# Patient Record
Sex: Female | Born: 2000 | Race: Black or African American | Hispanic: No | Marital: Single | Smoking: Never smoker
Health system: Southern US, Community
[De-identification: ages and names within clinical notes are randomized; demographics above are authoritative.]

## PROBLEM LIST (undated history)

## (undated) DIAGNOSIS — D571 Sickle-cell disease without crisis: Secondary | ICD-10-CM

## (undated) DIAGNOSIS — M419 Scoliosis, unspecified: Secondary | ICD-10-CM

## (undated) DIAGNOSIS — J45909 Unspecified asthma, uncomplicated: Secondary | ICD-10-CM

---
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
## 2019-01-16 IMAGING — CR DG NECK SOFT TISSUE
2 series · 2 of 2 positions shown · non-contrast
Comparison: None.

CLINICAL DATA: Sore throat

EXAM:
NECK SOFT TISSUES - 1+ VIEW

[neck lat]
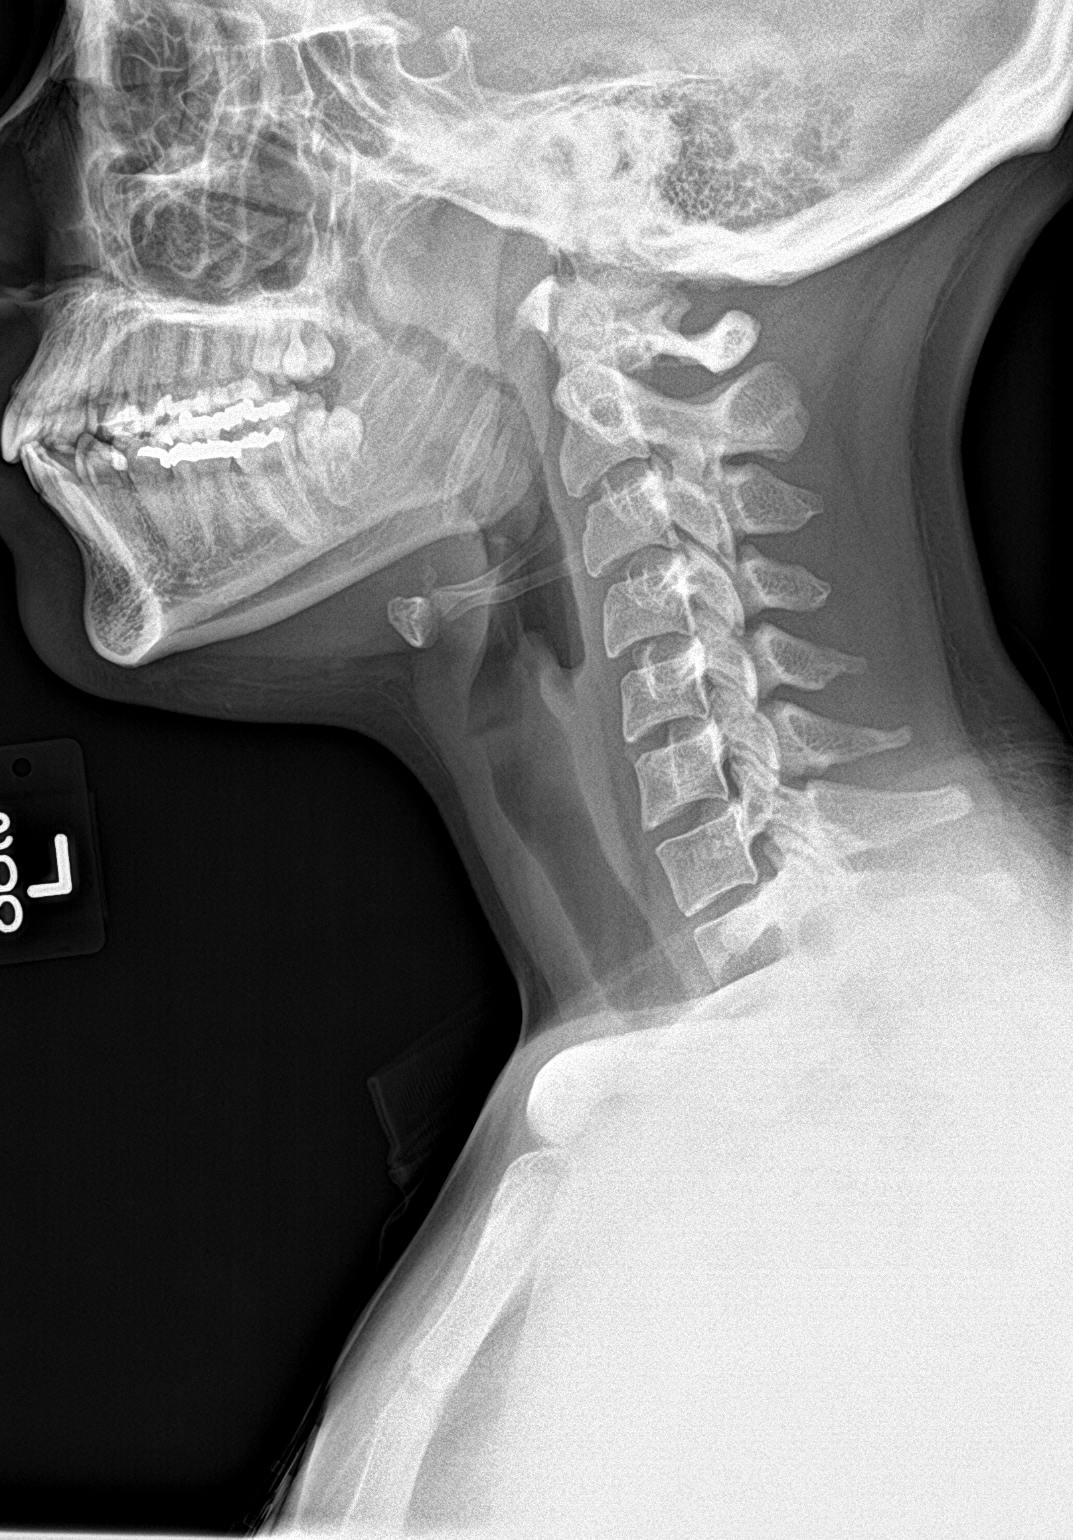

[neck ap]
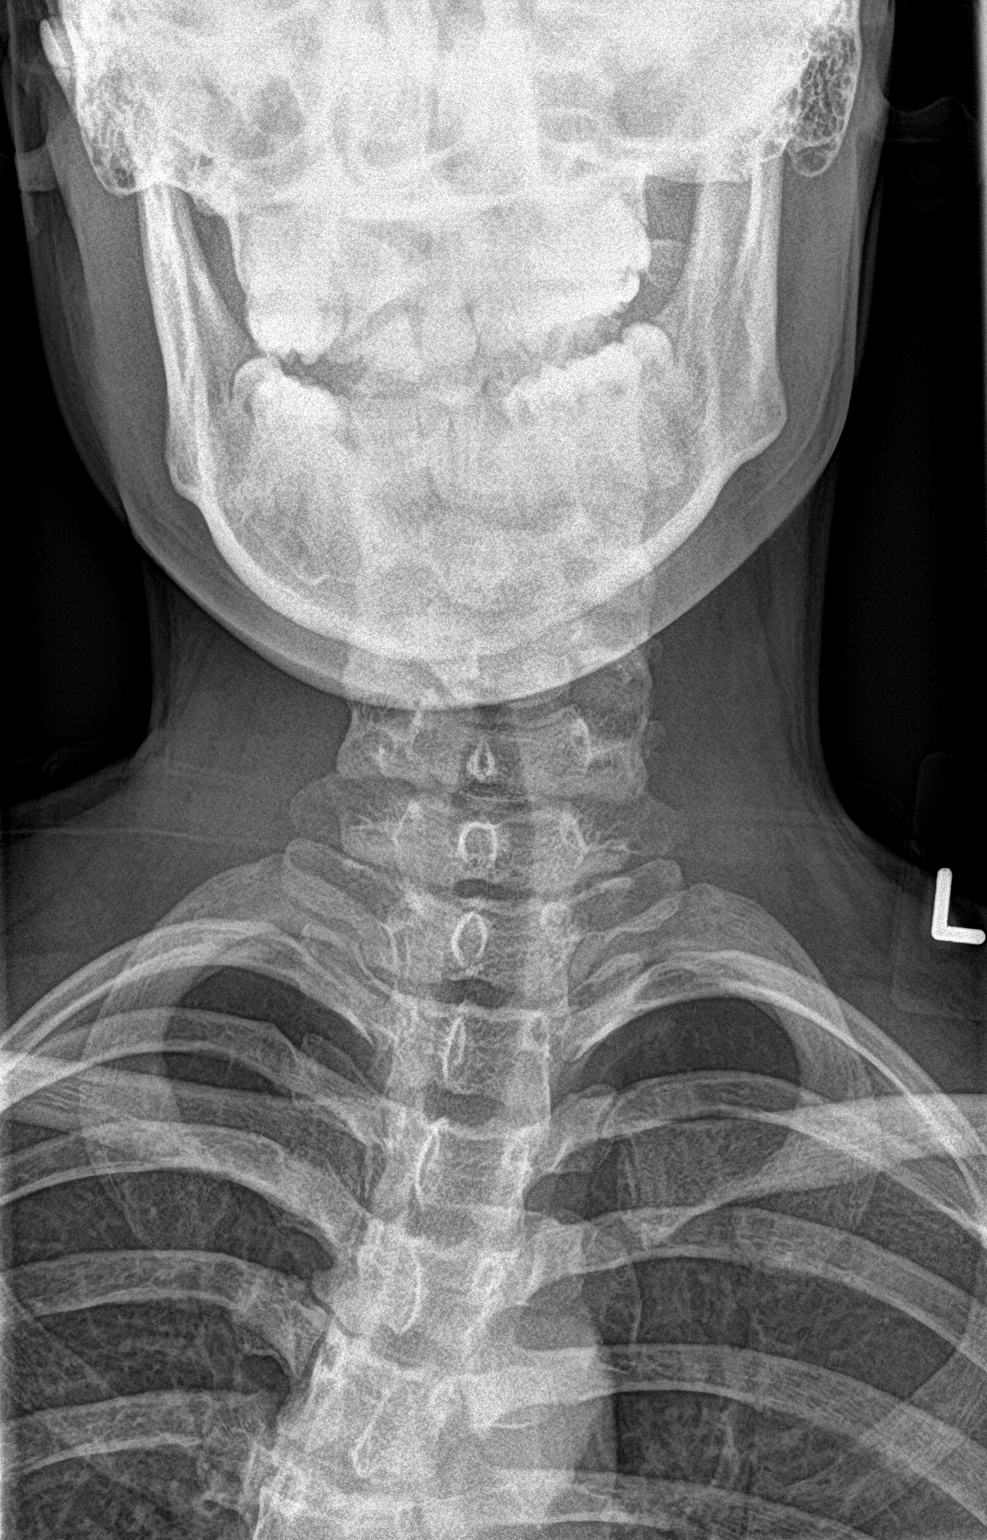

[2 of 2 positions shown; findings below may reference images not displayed]

FINDINGS: There is no evidence of retropharyngeal soft tissue swelling or
epiglottic enlargement. The cervical airway is unremarkable and no
radio-opaque foreign body identified.
IMPRESSION: Negative.

## 2019-01-16 IMAGING — CR DG CHEST 2V
2 series · 2 of 2 positions shown · non-contrast
Comparison: 11/03/2016

CLINICAL DATA: Pain

EXAM:
CHEST  2 VIEW

[chest pa]
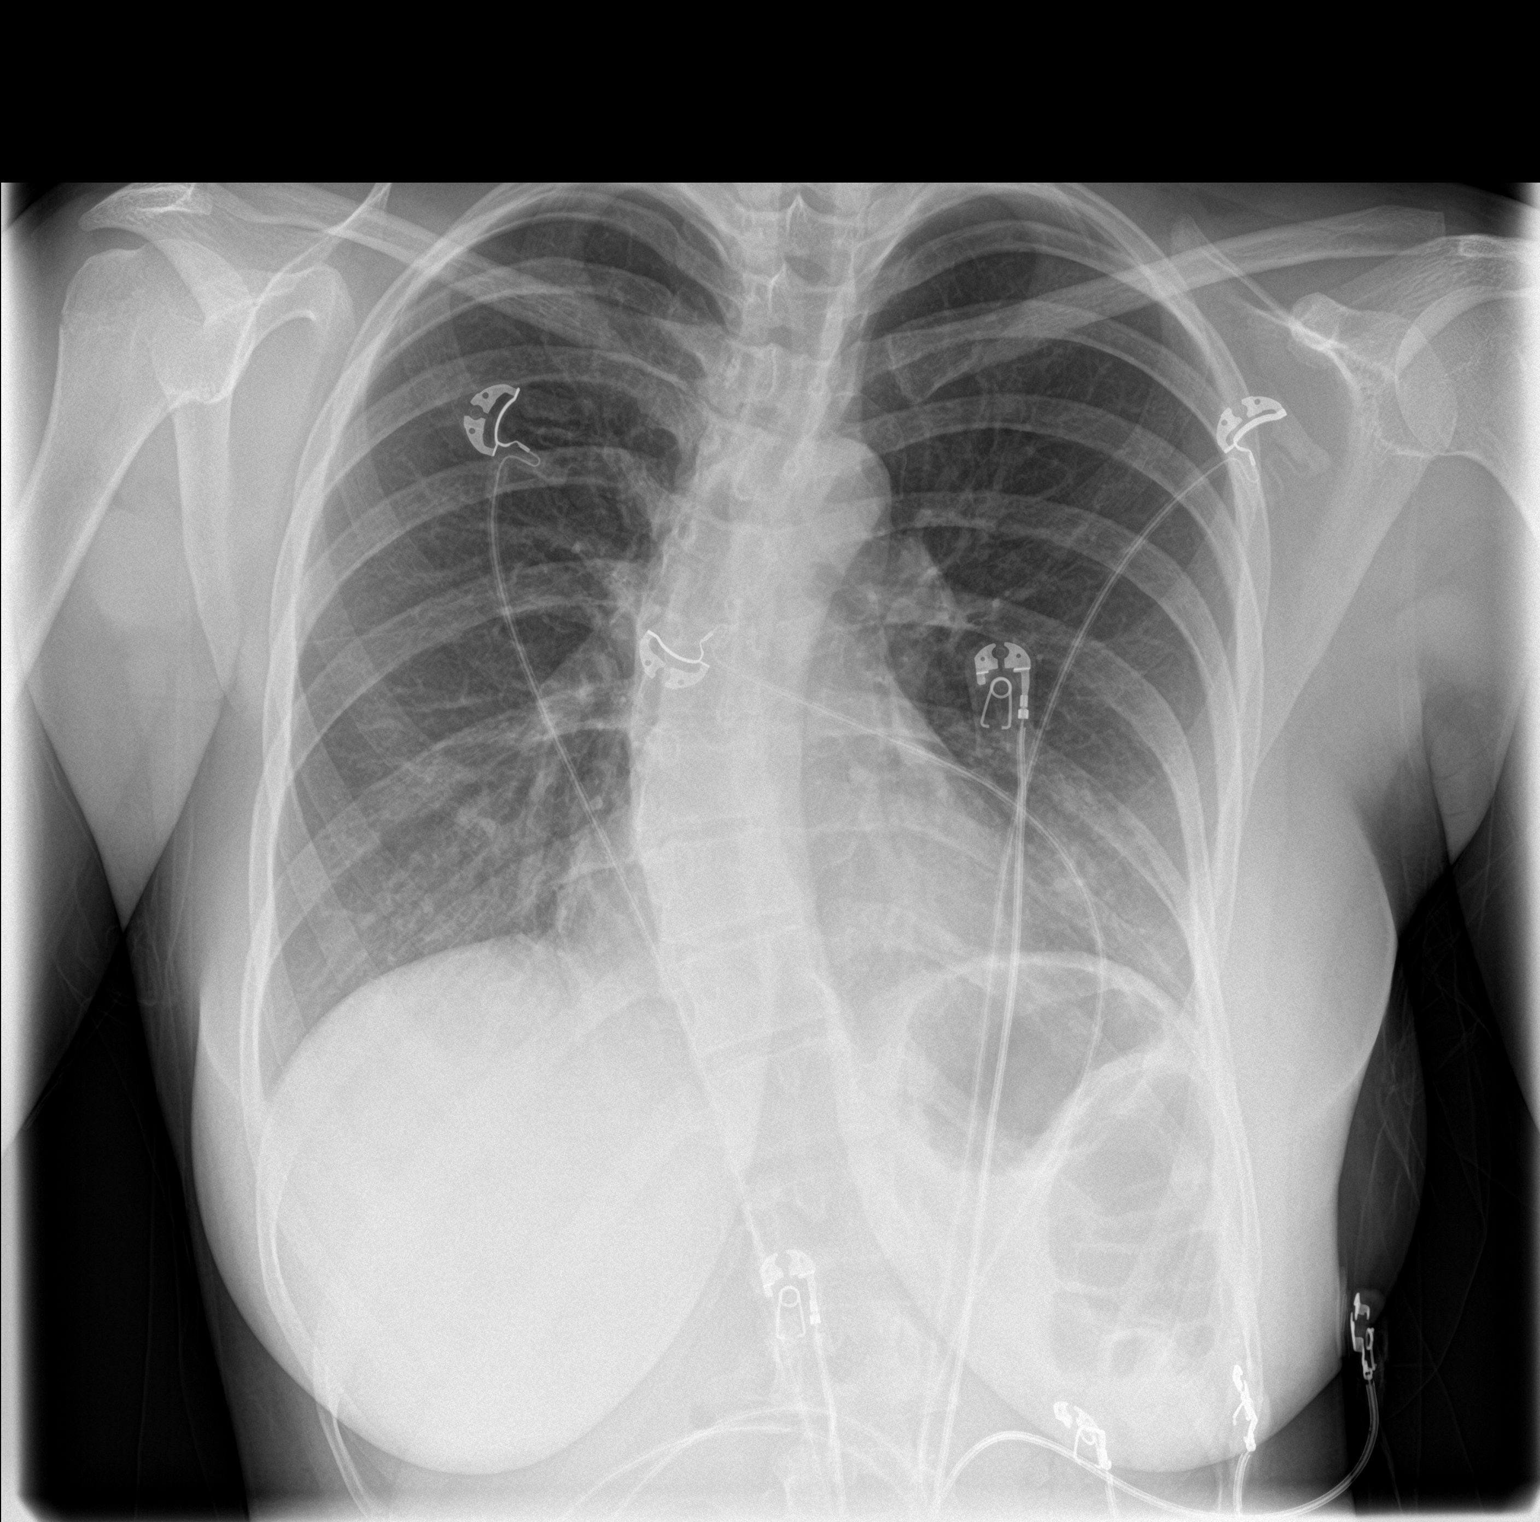

[chest lat]
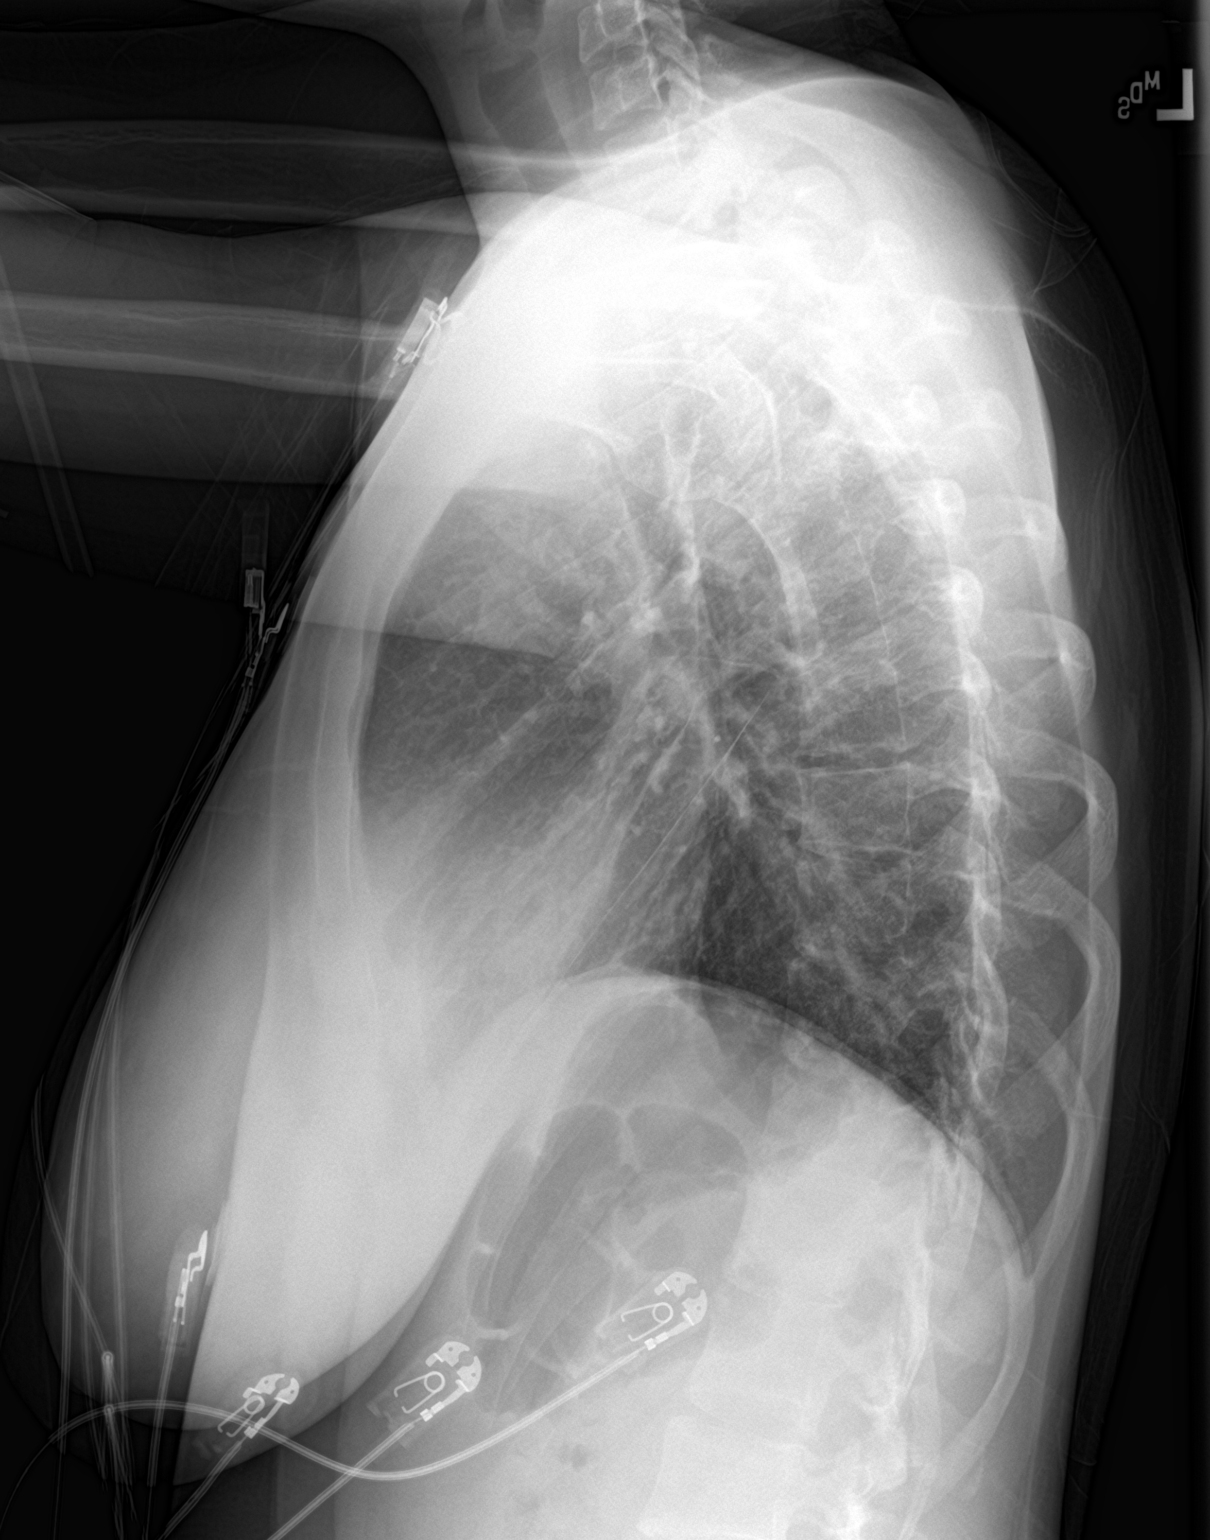

[2 of 2 positions shown; findings below may reference images not displayed]

FINDINGS: Heart and mediastinal contours are within normal limits. No focal
opacities or effusions. No acute bony abnormality. Thoracolumbar
scoliosis.
IMPRESSION: No active cardiopulmonary disease.

## 2019-08-21 ENCOUNTER — Telehealth: Payer: Medicaid Other | Admitting: Nurse Practitioner

## 2019-08-21 DIAGNOSIS — N3000 Acute cystitis without hematuria: Secondary | ICD-10-CM

## 2019-08-21 DIAGNOSIS — N760 Acute vaginitis: Secondary | ICD-10-CM | POA: Diagnosis not present

## 2019-08-21 DIAGNOSIS — B9689 Other specified bacterial agents as the cause of diseases classified elsewhere: Secondary | ICD-10-CM | POA: Diagnosis not present

## 2019-08-21 MED ORDER — METRONIDAZOLE 500 MG PO TABS: 500.0000 mg | ORAL_TABLET | Freq: Two times a day (BID) | ORAL | 0 refills | Status: DC

## 2019-08-21 MED ORDER — CEPHALEXIN 500 MG PO CAPS: 500.0000 mg | ORAL_CAPSULE | Freq: Two times a day (BID) | ORAL | 0 refills | Status: DC

## 2019-08-21 NOTE — Progress Notes (Signed)
I contacted patient by phone and she was describing urinary track symptoms. She also has discharge that is foul smelling and  Only smells when she voids.  We are sorry that you are not feeling well.  Here is how we plan to help!  Based on what you shared with me it looks like you most likely have a simple urinary tract infection.  A UTI (Urinary Tract Infection) is a bacterial infection of the bladder.  Most cases of urinary tract infections are simple to treat but a key part of your care is to encourage you to drink plenty of fluids and watch your symptoms carefully.  I have prescribed Keflex 500 mg twice a day for 7 days.  Your symptoms should gradually improve. Call us if the burning in your urine worsens, you develop worsening fever, back pain or pelvic pain or if your symptoms do not resolve after completing the antibiotic.  Urinary tract infections can be prevented by drinking plenty of water to keep your body hydrated.  Also be sure when you wipe, wipe from front to back and don't hold it in!  If possible, empty your bladder every 4 hours.  We are sorry that you are not feeling well. Here is how we plan to help! Based on what you shared with me it looks like you: May have a vaginosis due to bacteria  Vaginosis is an inflammation of the vagina that can result in discharge, itching and pain. The cause is usually a change in the normal balance of vaginal bacteria or an infection. Vaginosis can also result from reduced estrogen levels after menopause.  The most common causes of vaginosis are:   Bacterial vaginosis which results from an overgrowth of one on several organisms that are normally present in your vagina.   Yeast infections which are caused by a naturally occurring fungus called candida.   Vaginal atrophy (atrophic vaginosis) which results from the thinning of the vagina from reduced estrogen levels after menopause.   Trichomoniasis which is caused by a parasite and is  commonly transmitted by sexual intercourse.  Factors that increase your risk of developing vaginosis include: Marland Kitchen Medications, such as antibiotics and steroids . Uncontrolled diabetes . Use of hygiene products such as bubble bath, vaginal spray or vaginal deodorant . Douching . Wearing damp or tight-fitting clothing . Using an intrauterine device (IUD) for birth control . Hormonal changes, such as those associated with pregnancy, birth control pills or menopause . Sexual activity . Having a sexually transmitted infection  Your treatment plan is Metronidazole or Flagyl 500mg  twice a day for 7 days.  I have electronically sent this prescription into the pharmacy that you have chosen.  Be sure to take all of the medication as directed. Stop taking any medication if you develop a rash, tongue swelling or shortness of breath. Mothers who are breast feeding should consider pumping and discarding their breast milk while on these antibiotics. However, there is no consensus that infant exposure at these doses would be harmful.  Remember that medication creams can weaken latex condoms.   HOME CARE:  Good hygiene may prevent some types of vaginosis from recurring and may relieve some symptoms:  . Avoid baths, hot tubs and whirlpool spas. Rinse soap from your outer genital area after a shower, and dry the area well to prevent irritation. Don't use scented or harsh soaps, such as those with deodorant or antibacterial action. [place] Kitchen Avoid irritants. These include scented tampons and pads. . Wipe  from front to back after using the toilet. Doing so avoids spreading fecal bacteria to your vagina.  Other things that may help prevent vaginosis include:  Marland Kitchen Don't douche. Your vagina doesn't require cleansing other than normal bathing. Repetitive douching disrupts the normal organisms that reside in the vagina and can actually increase your risk of vaginal infection. Douching won't clear up a vaginal  infection. . Use a latex condom. Both female and female latex condoms may help you avoid infections spread by sexual contact. . Wear cotton underwear. Also wear pantyhose with a cotton crotch. If you feel comfortable without it, skip wearing underwear to bed. Yeast thrives in [employer] Your symptoms should improve in the next day or two.  GET HELP RIGHT AWAY IF:  . You have pain in your lower abdomen ( pelvic area or over your ovaries) . You develop nausea or vomiting . You develop a fever . Your discharge changes or worsens . You have persistent pain with intercourse . You develop shortness of breath, a rapid pulse, or you faint.  These symptoms could be signs of problems or infections that need to be evaluated by a medical provider now.  MAKE SURE YOU    Understand these instructions.  Will watch your condition.  Will get help right away if you are not doing well or get worse.  Your e-visit answers were reviewed by a board certified advanced clinical practitioner to complete your personal care plan. Depending upon the condition, your plan could have included both over the counter or prescription medications. Please review your pharmacy choice to make sure that you have choses a pharmacy that is open for you to pick up any needed prescription, Your safety is important to [place]. If you have drug allergies check your prescription carefully.   You can use MyChart to ask questions about today's visit, request a non-urgent call back, or ask for a work or school excuse for 24 hours related to this e-Visit. If it has been greater than 24 hours you will need to follow up with your provider, or enter a new e-Visit to address those concerns. You will get a MyChart message within the next two days asking about your experience. I hope that your e-visit has been valuable and will speed your recovery.  5-10 minutes spent reviewing and documenting in chart.

## 2020-03-19 ENCOUNTER — Emergency Department (HOSPITAL_COMMUNITY): Payer: Medicaid Other

## 2020-03-19 ENCOUNTER — Other Ambulatory Visit: Payer: Self-pay

## 2020-03-19 ENCOUNTER — Encounter (HOSPITAL_COMMUNITY): Payer: Self-pay | Admitting: Emergency Medicine

## 2020-03-19 ENCOUNTER — Emergency Department (HOSPITAL_COMMUNITY)
Admission: EM | Admit: 2020-03-19 | Discharge: 2020-03-20 | Disposition: A | Discharge status: Home / Self Care | Payer: Medicaid Other | Attending: Emergency Medicine | Admitting: Emergency Medicine

## 2020-03-19 DIAGNOSIS — M791 Myalgia, unspecified site: Secondary | ICD-10-CM | POA: Diagnosis not present

## 2020-03-19 DIAGNOSIS — R0602 Shortness of breath: Secondary | ICD-10-CM | POA: Insufficient documentation

## 2020-03-19 DIAGNOSIS — Z20822 Contact with and (suspected) exposure to covid-19: Secondary | ICD-10-CM | POA: Insufficient documentation

## 2020-03-19 DIAGNOSIS — R509 Fever, unspecified: Secondary | ICD-10-CM | POA: Insufficient documentation

## 2020-03-19 DIAGNOSIS — D57219 Sickle-cell/Hb-C disease with crisis, unspecified: Secondary | ICD-10-CM | POA: Diagnosis present

## 2020-03-19 DIAGNOSIS — Z5321 Procedure and treatment not carried out due to patient leaving prior to being seen by health care provider: Secondary | ICD-10-CM | POA: Insufficient documentation

## 2020-03-19 LAB — CBC WITH DIFFERENTIAL/PLATELET
Abs Immature Granulocytes: 0.09 10*3/uL — ABNORMAL HIGH (ref 0.00–0.07)
Basophils Absolute: 0.1 10*3/uL (ref 0.0–0.1)
Basophils Relative: 0 %
Eosinophils Absolute: 0.1 10*3/uL (ref 0.0–0.5)
Eosinophils Relative: 0 %
HCT: 30.8 % — ABNORMAL LOW (ref 36.0–46.0)
Hemoglobin: 11.1 g/dL — ABNORMAL LOW (ref 12.0–15.0)
Immature Granulocytes: 1 %
Lymphocytes Relative: 7 %
Lymphs Abs: 1.2 10*3/uL (ref 0.7–4.0)
MCH: 27.8 pg (ref 26.0–34.0)
MCHC: 36 g/dL (ref 30.0–36.0)
MCV: 77 fL — ABNORMAL LOW (ref 80.0–100.0)
Monocytes Absolute: 1.4 10*3/uL — ABNORMAL HIGH (ref 0.1–1.0)
Monocytes Relative: 8 %
Neutro Abs: 15.1 10*3/uL — ABNORMAL HIGH (ref 1.7–7.7)
Neutrophils Relative %: 84 %
Platelets: 352 10*3/uL (ref 150–400)
RBC: 4 MIL/uL (ref 3.87–5.11)
RDW: 14.6 % (ref 11.5–15.5)
WBC: 17.9 10*3/uL — ABNORMAL HIGH (ref 4.0–10.5)
nRBC: 0.4 % — ABNORMAL HIGH (ref 0.0–0.2)

## 2020-03-19 LAB — COMPREHENSIVE METABOLIC PANEL
ALT: 14 U/L (ref 0–44)
AST: 20 U/L (ref 15–41)
Albumin: 3.9 g/dL (ref 3.5–5.0)
Alkaline Phosphatase: 49 U/L (ref 38–126)
Anion gap: 10 (ref 5–15)
BUN: <5 mg/dL — ABNORMAL LOW (ref 6–20)
CO2: 22 mmol/L (ref 22–32)
Calcium: 8.9 mg/dL (ref 8.9–10.3)
Chloride: 106 mmol/L (ref 98–111)
Creatinine, Ser: 0.75 mg/dL (ref 0.44–1.00)
GFR calc Af Amer: >60 mL/min (ref >60)
GFR calc non Af Amer: >60 mL/min (ref >60)
Glucose, Bld: 89 mg/dL (ref 70–99)
Potassium: 3.5 mmol/L (ref 3.5–5.1)
Sodium: 138 mmol/L (ref 135–145)
Total Bilirubin: 1.1 mg/dL (ref 0.3–1.2)
Total Protein: 7 g/dL (ref 6.5–8.1)

## 2020-03-19 LAB — SARS CORONAVIRUS 2 BY RT PCR (HOSPITAL ORDER, PERFORMED IN ~~LOC~~ HOSPITAL LAB): SARS Coronavirus 2: NEGATIVE

## 2020-03-19 LAB — RETICULOCYTES
Immature Retic Fract: 40 % — ABNORMAL HIGH (ref 2.3–15.9)
RBC.: 3.99 MIL/uL (ref 3.87–5.11)
Retic Count, Absolute: 149.2 10*3/uL (ref 19.0–186.0)
Retic Ct Pct: 3.7 % — ABNORMAL HIGH (ref 0.4–3.1)

## 2020-03-19 LAB — I-STAT BETA HCG BLOOD, ED (MC, WL, AP ONLY): I-stat hCG, quantitative: <5 m[IU]/mL (ref <5)

## 2020-03-19 MED ORDER — ACETAMINOPHEN 500 MG PO TABS
1000.0000 mg | ORAL_TABLET | Freq: Once | ORAL | Status: AC
  Administered 2020-03-19: 1000 mg via ORAL
  Filled 2020-03-19: qty 2

## 2020-03-19 NOTE — ED Triage Notes (Signed)
Patient reports sickle cell crisis X1 day. Patient also concerned she has COVID. Reports SOB, fever/chills, body aches. Reports she was vaccinated against COVID in March. Febrile and tachycardic in triage

## 2020-03-20 ENCOUNTER — Emergency Department (HOSPITAL_COMMUNITY)
Admission: EM | Admit: 2020-03-20 | Discharge: 2020-03-20 | Disposition: A | Discharge status: Home / Self Care | Payer: Medicaid Other | Source: Home / Self Care | Attending: Pediatric Emergency Medicine | Admitting: Pediatric Emergency Medicine

## 2020-03-20 ENCOUNTER — Encounter (HOSPITAL_COMMUNITY): Payer: Self-pay

## 2020-03-20 ENCOUNTER — Other Ambulatory Visit: Payer: Self-pay

## 2020-03-20 DIAGNOSIS — D5700 Hb-SS disease with crisis, unspecified: Secondary | ICD-10-CM

## 2020-03-20 DIAGNOSIS — J45909 Unspecified asthma, uncomplicated: Secondary | ICD-10-CM | POA: Insufficient documentation

## 2020-03-20 DIAGNOSIS — Z79899 Other long term (current) drug therapy: Secondary | ICD-10-CM | POA: Insufficient documentation

## 2020-03-20 DIAGNOSIS — R509 Fever, unspecified: Secondary | ICD-10-CM | POA: Insufficient documentation

## 2020-03-20 LAB — COMPREHENSIVE METABOLIC PANEL
ALT: 14 U/L (ref 0–44)
AST: 19 U/L (ref 15–41)
Albumin: 3.9 g/dL (ref 3.5–5.0)
Alkaline Phosphatase: 49 U/L (ref 38–126)
Anion gap: 9 (ref 5–15)
BUN: <5 mg/dL — ABNORMAL LOW (ref 6–20)
CO2: 23 mmol/L (ref 22–32)
Calcium: 8.8 mg/dL — ABNORMAL LOW (ref 8.9–10.3)
Chloride: 104 mmol/L (ref 98–111)
Creatinine, Ser: 0.69 mg/dL (ref 0.44–1.00)
GFR calc Af Amer: >60 mL/min (ref >60)
GFR calc non Af Amer: >60 mL/min (ref >60)
Glucose, Bld: 83 mg/dL (ref 70–99)
Potassium: 3.7 mmol/L (ref 3.5–5.1)
Sodium: 136 mmol/L (ref 135–145)
Total Bilirubin: 1.3 mg/dL — ABNORMAL HIGH (ref 0.3–1.2)
Total Protein: 7.3 g/dL (ref 6.5–8.1)

## 2020-03-20 LAB — CBC WITH DIFFERENTIAL/PLATELET
Abs Immature Granulocytes: 0 10*3/uL (ref 0.00–0.07)
Basophils Absolute: 0.3 10*3/uL — ABNORMAL HIGH (ref 0.0–0.1)
Basophils Relative: 2 %
Eosinophils Absolute: 0.7 10*3/uL — ABNORMAL HIGH (ref 0.0–0.5)
Eosinophils Relative: 4 %
HCT: 31.6 % — ABNORMAL LOW (ref 36.0–46.0)
Hemoglobin: 11.4 g/dL — ABNORMAL LOW (ref 12.0–15.0)
Lymphocytes Relative: 13 %
Lymphs Abs: 2.2 10*3/uL (ref 0.7–4.0)
MCH: 27.5 pg (ref 26.0–34.0)
MCHC: 36.1 g/dL — ABNORMAL HIGH (ref 30.0–36.0)
MCV: 76.1 fL — ABNORMAL LOW (ref 80.0–100.0)
Monocytes Absolute: 1.2 10*3/uL — ABNORMAL HIGH (ref 0.1–1.0)
Monocytes Relative: 7 %
Neutro Abs: 12.7 10*3/uL — ABNORMAL HIGH (ref 1.7–7.7)
Neutrophils Relative %: 74 %
Platelets: 340 10*3/uL (ref 150–400)
RBC: 4.15 MIL/uL (ref 3.87–5.11)
RDW: 14.6 % (ref 11.5–15.5)
WBC: 17.1 10*3/uL — ABNORMAL HIGH (ref 4.0–10.5)
nRBC: 0.2 % (ref 0.0–0.2)
nRBC: 1 /100 WBC — ABNORMAL HIGH

## 2020-03-20 LAB — RETICULOCYTES
Immature Retic Fract: 27.9 % — ABNORMAL HIGH (ref 2.3–15.9)
RBC.: 4.1 MIL/uL (ref 3.87–5.11)
Retic Count, Absolute: 137.4 10*3/uL (ref 19.0–186.0)
Retic Ct Pct: 3.4 % — ABNORMAL HIGH (ref 0.4–3.1)

## 2020-03-20 LAB — I-STAT BETA HCG BLOOD, ED (MC, WL, AP ONLY): I-stat hCG, quantitative: <5 m[IU]/mL (ref <5)

## 2020-03-20 MED ORDER — SODIUM CHLORIDE 0.9 % IV BOLUS
1000.0000 mL | Freq: Once | INTRAVENOUS | Status: AC
  Administered 2020-03-20: 1000 mL via INTRAVENOUS

## 2020-03-20 MED ORDER — SODIUM CHLORIDE 0.9 % IV SOLN
2.0000 g | Freq: Once | INTRAVENOUS | Status: AC
  Administered 2020-03-20: 2 g via INTRAVENOUS
  Filled 2020-03-20: qty 2

## 2020-03-20 MED ORDER — KETOROLAC TROMETHAMINE 15 MG/ML IJ SOLN
15.0000 mg | Freq: Once | INTRAMUSCULAR | Status: AC
  Administered 2020-03-20: 15 mg via INTRAVENOUS
  Filled 2020-03-20: qty 1

## 2020-03-20 MED ORDER — IBUPROFEN 800 MG PO TABS: 800.0000 mg | ORAL_TABLET | Freq: Three times a day (TID) | ORAL | 0 refills | Status: DC

## 2020-03-20 MED ORDER — MORPHINE SULFATE (PF) 4 MG/ML IV SOLN
4.0000 mg | Freq: Once | INTRAVENOUS | Status: AC
  Administered 2020-03-20: 4 mg via INTRAVENOUS
  Filled 2020-03-20: qty 1

## 2020-03-20 MED ORDER — SODIUM CHLORIDE 0.9% FLUSH
3.0000 mL | Freq: Once | INTRAVENOUS | Status: AC
  Administered 2020-03-20: 3 mL via INTRAVENOUS

## 2020-03-20 NOTE — ED Notes (Signed)
Patient with pain improved, color pink,chets clear,good aeration,no retractions 3 plus pulses<2sec refill, ,bolus infusing, observing

## 2020-03-20 NOTE — ED Notes (Addendum)
patiient awake alert, color pink,chest clear,good aeration,no retractions 3 plus pulses<2sec refill,patient ambulatory to wr after avs reviewed fells better with nearly complete pain resolution

## 2020-03-20 NOTE — ED Provider Notes (Signed)
[facility] EMERGENCY DEPARTMENT Provider Note   CSN: [id number] Arrival date & time: 03/20/20  1615     History Chief Complaint  Patient presents with  . Sickle Cell Pain Crisis    Shelby Coffey is a 19 y.o. female.  19 year old with history of hemoglobin [place] disease here with chest and extremity pain for 2 days that is worsened over the last 12 to 24 hours. She is also had fever over the last 12 to 24 hours. Patient denies any other infectious symptoms. Patient has no known Covid exposure. Patient has tried Motrin at home without relief of her pain. Patient came to emergency part last night but the wait was too long so she not stay in for evaluation.  The history is provided by the patient. No language interpreter was used.  Sickle Cell Pain Crisis Location:  Chest, lower extremity and upper extremity Severity:  Mild Onset quality:  Gradual Duration:  2 days Similar to previous crisis episodes: yes   Timing:  Constant Progression:  Unchanged Chronicity:  Recurrent Sickle cell genotype:  [place] History of pulmonary emboli: no   Context: not alcohol consumption, not change in medication and not dehydration   Relieved by:  Nothing Worsened by:  Nothing Ineffective treatments:  OTC medications Associated symptoms: chest pain and fever   Associated symptoms: no congestion, no cough, no nausea, no shortness of breath, no vomiting and no wheezing   Risk factors: prior acute chest        Past Medical History:  Diagnosis Date  . Asthma   . Scoliosis   . Sickle cell anemia [facility])     Patient Active Problem List   Diagnosis Date Noted  . Severe recurrent major depression without psychotic features (HCC) 09/04/2017  . Acute chest syndrome (HCC) 02/08/2017  . Sickle cell crisis (HCC) 02/06/2017  . Fever 02/06/2017  . Leukocytosis 02/06/2017  . Scoliosis 07/07/2013    History reviewed. No pertinent surgical history.   OB History    Gravida  0   Para  0     Term  0   Preterm  0   AB  0   Living  0     SAB  0   TAB  0   Ectopic  0   Multiple  0   Live Births  0           Family History  Problem Relation Age of Onset  . Breast cancer Mother   . Sickle cell trait Sister   . Diabetes type II Maternal Grandmother     Social History   Tobacco Use  . Smoking status: Never Smoker  . Smokeless tobacco: Never Used  Substance Use Topics  . Alcohol use: No  . Drug use: Yes    Types: Marijuana    Comment: less than once a week    Home Medications Prior to Admission medications   Medication Sig Start Date End Date Taking? Authorizing Provider  acetaminophen (TYLENOL) 325 MG tablet Take 325-650 mg by mouth every 6 (six) hours as needed (for pain or headaches).    [provider]  albuterol (PROVENTIL HFA;VENTOLIN HFA) 108 (90 Base) MCG/ACT inhaler Inhale 2 puffs into the lungs every 6 (six) hours as needed for wheezing or shortness of breath.    [provider]  cephALEXin (KEFLEX) 500 MG capsule Take 1 capsule (500 mg total) by mouth 2 (two) times daily. 08/21/19   Bennie Pierini, FNP  etonogestrel (NEXPLANON) [phone]  MG IMPL implant 68 mg by Subdermal route once.    [provider]  ibuprofen (ADVIL) 800 MG tablet Take 1 tablet (800 mg total) by mouth 3 (three) times daily. 03/20/20   Sharene Skeans, MD  metroNIDAZOLE (FLAGYL) 500 MG tablet Take 1 tablet (500 mg total) by mouth 2 (two) times daily. 08/21/19   Daphine Deutscher, Mary-Margaret, FNP  oxyCODONE (OXY IR/ROXICODONE) 5 MG immediate release tablet Take 1 tablet (5 mg total) by mouth every 4 (four) hours as needed for severe pain. 07/02/17   Ree Shay, MD    Allergies    Patient has no known allergies.  Review of Systems   Review of Systems  Constitutional: Positive for fever.  HENT: Negative for congestion.   Respiratory: Negative for cough, shortness of breath and wheezing.   Cardiovascular: Positive for chest pain.  Gastrointestinal: Negative  for nausea and vomiting.  All other systems reviewed and are negative.   Physical Exam Updated Vital Signs BP 113/66 (BP Location: Left Arm)   Pulse 71   Temp 98.9 F (37.2 C) (Oral)   Resp 18   Wt 63.6 kg Comment: verified by patient/standing  LMP 03/11/2020 (Approximate)   SpO2 100%   BMI 22.63 kg/m   Physical Exam Vitals and nursing note reviewed.  Constitutional:      Appearance: Normal appearance.  HENT:     Head: Normocephalic and atraumatic.     Nose: Nose normal.     Mouth/Throat:     Mouth: Mucous membranes are moist.  Eyes:     Conjunctiva/sclera: Conjunctivae normal.  Cardiovascular:     Rate and Rhythm: Normal rate and regular rhythm.     Pulses: Normal pulses.     Heart sounds: Normal heart sounds. No murmur heard.   Pulmonary:     Effort: Pulmonary effort is normal. No respiratory distress.     Breath sounds: Normal breath sounds. No stridor. No wheezing, rhonchi or rales.  Chest:     Chest wall: Tenderness present.  Abdominal:     General: Abdomen is flat. Bowel sounds are normal. There is no distension.     Tenderness: There is no abdominal tenderness. There is no guarding.  Musculoskeletal:        General: Normal range of motion.     Cervical back: Normal range of motion and neck supple.  Skin:    General: Skin is warm and dry.     Capillary Refill: Capillary refill takes less than 2 seconds.  Neurological:     General: No focal deficit present.     Mental Status: She is alert and oriented to person, place, and time.     ED Results / Procedures / Treatments   Labs (all labs ordered are listed, but only abnormal results are displayed) Labs Reviewed  COMPREHENSIVE METABOLIC PANEL - Abnormal; Notable for the following components:      Result Value   BUN <5 (*)    Calcium 8.8 (*)    Total Bilirubin 1.3 (*)    All other components within normal limits  CBC WITH DIFFERENTIAL/PLATELET - Abnormal; Notable for the following components:   WBC  17.1 (*)    Hemoglobin 11.4 (*)    HCT 31.6 (*)    MCV 76.1 (*)    MCHC 36.1 (*)    Neutro Abs 12.7 (*)    Monocytes Absolute 1.2 (*)    Eosinophils Absolute 0.7 (*)    Basophils Absolute 0.3 (*)    nRBC 1 (*)  All other components within normal limits  RETICULOCYTES - Abnormal; Notable for the following components:   Retic Ct Pct 3.4 (*)    Immature Retic Fract 27.9 (*)    All other components within normal limits  CULTURE, BLOOD (SINGLE)  I-STAT BETA HCG BLOOD, ED ([facility], [facility], [facility] ONLY)    EKG None  Radiology DG Chest 1 View  Result Date: 03/19/2020 CLINICAL DATA:  Sickle cell crisis for 1 day, shortness of breath EXAM: CHEST  1 VIEW COMPARISON:  06/28/2018 FINDINGS: Cardiac shadows within normal limits. The lungs are well aerated bilaterally. Mild scoliosis of the thoracic spine concave to the left is noted. No focal infiltrate is seen. IMPRESSION: No acute abnormality noted. Electronically Signed   By: Alcide Clever M.D.   On: 03/19/2020 21:43    Procedures Procedures (including critical care time)  Medications Ordered in ED Medications  sodium chloride flush (NS) 0.9 % injection 3 mL (3 mLs Intravenous Given 03/20/20 1645)  ketorolac (TORADOL) 15 MG/ML injection 15 mg (15 mg Intravenous Given 03/20/20 1712)  morphine 4 MG/ML injection 4 mg (4 mg Intravenous Given 03/20/20 1713)  cefTRIAXone (ROCEPHIN) 2 g in sodium chloride 0.9 % 100 mL IVPB (2 g Intravenous New Bag/Given 03/20/20 1742)  sodium chloride 0.9 % bolus 1,000 mL (1,000 mLs Intravenous New Bag/Given 03/20/20 1645)    ED Course  I have reviewed the triage vital signs and the nursing notes.  Pertinent labs & imaging results that were available during my care of the patient were reviewed by me and considered in my medical decision making (see chart for details).    MDM Rules/Calculators/A&P                          19 y.o. with sickle cell disease here with fever and pain. Will get blood culture and give Rocephin,  check a CBC and CMP, give normal saline bolus and pain medications. I reviewed the chart from yesterday she had a PCR Covid test that was negative as well as a negative chest x-ray.  6:23 PM Patient reports that her pain has nearly resolved.  I discussed at length with patient reports she feels comfortable going home and thinks that her home medications can manage her pain from here on out.  Patient did have a fever yesterday and got Rocephin here and has culture pending, so recommended that she follow-up with her doctor to check the culture results tomorrow.  Discussed specific signs and symptoms of concern for which they should return to ED.  Discharge with close follow up with primary care physician tomorrow.  Patient comfortable with this plan of care.    Final Clinical Impression(s) / ED Diagnoses Final diagnoses:  Sickle cell pain crisis (HCC)  Fever, unspecified fever cause    Rx / DC Orders ED Discharge Orders         Ordered    ibuprofen (ADVIL) 800 MG tablet  3 times daily        03/20/20 Geryl Councilman, MD 03/20/20 1824

## 2020-03-20 NOTE — ED Triage Notes (Signed)
Here last night with fever, left without being seen, had motrin 800mg  at 1pm this afternoon, pain to arms legs and chest, no difficulty breathing

## 2020-03-25 LAB — CULTURE, BLOOD (SINGLE)
Culture: NO GROWTH
Special Requests: ADEQUATE

## 2020-07-19 ENCOUNTER — Other Ambulatory Visit: Payer: Self-pay

## 2020-07-19 ENCOUNTER — Encounter (HOSPITAL_COMMUNITY): Payer: Self-pay | Admitting: Emergency Medicine

## 2020-07-19 ENCOUNTER — Emergency Department (HOSPITAL_COMMUNITY): Payer: Medicaid Other

## 2020-07-19 ENCOUNTER — Inpatient Hospital Stay (HOSPITAL_COMMUNITY)
Admission: EM | Admit: 2020-07-19 | Discharge: 2020-07-26 | DRG: 812 | Disposition: A | Discharge status: Home / Self Care | Payer: Medicaid Other | Attending: Internal Medicine | Admitting: Internal Medicine

## 2020-07-19 DIAGNOSIS — D57211 Sickle-cell/Hb-C disease with acute chest syndrome: Secondary | ICD-10-CM | POA: Diagnosis present

## 2020-07-19 DIAGNOSIS — R52 Pain, unspecified: Secondary | ICD-10-CM | POA: Diagnosis not present

## 2020-07-19 DIAGNOSIS — R5081 Fever presenting with conditions classified elsewhere: Secondary | ICD-10-CM | POA: Diagnosis present

## 2020-07-19 DIAGNOSIS — Z20822 Contact with and (suspected) exposure to covid-19: Secondary | ICD-10-CM | POA: Diagnosis present

## 2020-07-19 DIAGNOSIS — G894 Chronic pain syndrome: Secondary | ICD-10-CM | POA: Diagnosis present

## 2020-07-19 DIAGNOSIS — R000 Tachycardia, unspecified: Secondary | ICD-10-CM | POA: Diagnosis present

## 2020-07-19 DIAGNOSIS — D5700 Hb-SS disease with crisis, unspecified: Secondary | ICD-10-CM | POA: Diagnosis not present

## 2020-07-19 DIAGNOSIS — D72829 Elevated white blood cell count, unspecified: Secondary | ICD-10-CM | POA: Diagnosis present

## 2020-07-19 DIAGNOSIS — D5701 Hb-SS disease with acute chest syndrome: Secondary | ICD-10-CM | POA: Diagnosis not present

## 2020-07-19 DIAGNOSIS — M419 Scoliosis, unspecified: Secondary | ICD-10-CM | POA: Diagnosis present

## 2020-07-19 DIAGNOSIS — R7401 Elevation of levels of liver transaminase levels: Secondary | ICD-10-CM

## 2020-07-19 DIAGNOSIS — E876 Hypokalemia: Secondary | ICD-10-CM | POA: Diagnosis present

## 2020-07-19 DIAGNOSIS — Z832 Family history of diseases of the blood and blood-forming organs and certain disorders involving the immune mechanism: Secondary | ICD-10-CM

## 2020-07-19 DIAGNOSIS — Z79899 Other long term (current) drug therapy: Secondary | ICD-10-CM | POA: Diagnosis not present

## 2020-07-19 DIAGNOSIS — Z8659 Personal history of other mental and behavioral disorders: Secondary | ICD-10-CM | POA: Diagnosis not present

## 2020-07-19 DIAGNOSIS — R509 Fever, unspecified: Secondary | ICD-10-CM | POA: Diagnosis not present

## 2020-07-19 DIAGNOSIS — J45909 Unspecified asthma, uncomplicated: Secondary | ICD-10-CM | POA: Diagnosis present

## 2020-07-19 DIAGNOSIS — Z23 Encounter for immunization: Secondary | ICD-10-CM

## 2020-07-19 DIAGNOSIS — Z793 Long term (current) use of hormonal contraceptives: Secondary | ICD-10-CM | POA: Diagnosis not present

## 2020-07-19 LAB — COMPREHENSIVE METABOLIC PANEL
ALT: 49 U/L — ABNORMAL HIGH (ref 0–44)
AST: 81 U/L — ABNORMAL HIGH (ref 15–41)
Albumin: 3.4 g/dL — ABNORMAL LOW (ref 3.5–5.0)
Alkaline Phosphatase: 50 U/L (ref 38–126)
Anion gap: 11 (ref 5–15)
BUN: <5 mg/dL — ABNORMAL LOW (ref 6–20)
CO2: 21 mmol/L — ABNORMAL LOW (ref 22–32)
Calcium: 8.7 mg/dL — ABNORMAL LOW (ref 8.9–10.3)
Chloride: 104 mmol/L (ref 98–111)
Creatinine, Ser: 0.78 mg/dL (ref 0.44–1.00)
GFR, Estimated: >60 mL/min (ref >60)
Glucose, Bld: 143 mg/dL — ABNORMAL HIGH (ref 70–99)
Potassium: 2.9 mmol/L — ABNORMAL LOW (ref 3.5–5.1)
Sodium: 136 mmol/L (ref 135–145)
Total Bilirubin: 1.1 mg/dL (ref 0.3–1.2)
Total Protein: 6.5 g/dL (ref 6.5–8.1)

## 2020-07-19 LAB — URINALYSIS, ROUTINE W REFLEX MICROSCOPIC
Bilirubin Urine: NEGATIVE
Glucose, UA: NEGATIVE mg/dL
Hgb urine dipstick: NEGATIVE
Ketones, ur: NEGATIVE mg/dL
Leukocytes,Ua: NEGATIVE
Nitrite: NEGATIVE
Protein, ur: NEGATIVE mg/dL
Specific Gravity, Urine: 1.006 (ref 1.005–1.030)
pH: 6 (ref 5.0–8.0)

## 2020-07-19 LAB — CBC WITH DIFFERENTIAL/PLATELET
Abs Immature Granulocytes: 0.03 10*3/uL (ref 0.00–0.07)
Basophils Absolute: 0.1 10*3/uL (ref 0.0–0.1)
Basophils Relative: 1 %
Eosinophils Absolute: 0.3 10*3/uL (ref 0.0–0.5)
Eosinophils Relative: 3 %
HCT: 29.2 % — ABNORMAL LOW (ref 36.0–46.0)
Hemoglobin: 10.6 g/dL — ABNORMAL LOW (ref 12.0–15.0)
Immature Granulocytes: 0 %
Lymphocytes Relative: 21 %
Lymphs Abs: 1.7 10*3/uL (ref 0.7–4.0)
MCH: 28 pg (ref 26.0–34.0)
MCHC: 36.3 g/dL — ABNORMAL HIGH (ref 30.0–36.0)
MCV: 77.2 fL — ABNORMAL LOW (ref 80.0–100.0)
Monocytes Absolute: 0.6 10*3/uL (ref 0.1–1.0)
Monocytes Relative: 7 %
Neutro Abs: 5.6 10*3/uL (ref 1.7–7.7)
Neutrophils Relative %: 68 %
Platelets: 327 10*3/uL (ref 150–400)
RBC: 3.78 MIL/uL — ABNORMAL LOW (ref 3.87–5.11)
RDW: 15 % (ref 11.5–15.5)
WBC: 8.3 10*3/uL (ref 4.0–10.5)
nRBC: 1 % — ABNORMAL HIGH (ref 0.0–0.2)

## 2020-07-19 LAB — LIPASE, BLOOD: Lipase: 28 U/L (ref 11–51)

## 2020-07-19 LAB — BASIC METABOLIC PANEL
Anion gap: 8 (ref 5–15)
BUN: <5 mg/dL — ABNORMAL LOW (ref 6–20)
CO2: 22 mmol/L (ref 22–32)
Calcium: 8 mg/dL — ABNORMAL LOW (ref 8.9–10.3)
Chloride: 107 mmol/L (ref 98–111)
Creatinine, Ser: 0.51 mg/dL (ref 0.44–1.00)
GFR, Estimated: >60 mL/min (ref >60)
Glucose, Bld: 102 mg/dL — ABNORMAL HIGH (ref 70–99)
Potassium: 3.2 mmol/L — ABNORMAL LOW (ref 3.5–5.1)
Sodium: 137 mmol/L (ref 135–145)

## 2020-07-19 LAB — LACTIC ACID, PLASMA
Lactic Acid, Venous: 3.1 mmol/L (ref 0.5–1.9)
Lactic Acid, Venous: 0.9 mmol/L (ref 0.5–1.9)

## 2020-07-19 LAB — RETICULOCYTES
Immature Retic Fract: 28 % — ABNORMAL HIGH (ref 2.3–15.9)
RBC.: 3.68 MIL/uL — ABNORMAL LOW (ref 3.87–5.11)
Retic Count, Absolute: 145.7 10*3/uL (ref 19.0–186.0)
Retic Ct Pct: 4 % — ABNORMAL HIGH (ref 0.4–3.1)

## 2020-07-19 LAB — RESP PANEL BY RT-PCR (FLU A&B, COVID) ARPGX2
Influenza A by PCR: NEGATIVE
Influenza B by PCR: NEGATIVE
SARS Coronavirus 2 by RT PCR: NEGATIVE

## 2020-07-19 LAB — I-STAT BETA HCG BLOOD, ED (MC, WL, AP ONLY): I-stat hCG, quantitative: <5 m[IU]/mL (ref <5)

## 2020-07-19 LAB — HIV ANTIBODY (ROUTINE TESTING W REFLEX): HIV Screen 4th Generation wRfx: NONREACTIVE

## 2020-07-19 LAB — MAGNESIUM: Magnesium: 1.6 mg/dL — ABNORMAL LOW (ref 1.7–2.4)

## 2020-07-19 MED ORDER — DIPHENHYDRAMINE HCL 25 MG PO CAPS
25.0000 mg | ORAL_CAPSULE | ORAL | Status: DC | PRN
  Administered 2020-07-19: 09:00:00 25 mg via ORAL
  Filled 2020-07-19: qty 1

## 2020-07-19 MED ORDER — ACETAMINOPHEN 325 MG PO TABS
650.0000 mg | ORAL_TABLET | ORAL | Status: DC | PRN
  Administered 2020-07-20 – 2020-07-26 (×11): 650 mg via ORAL
  Filled 2020-07-19 (×11): qty 2

## 2020-07-19 MED ORDER — SODIUM CHLORIDE 0.9 % IV SOLN
1.0000 g | Freq: Once | INTRAVENOUS | Status: AC
  Administered 2020-07-19: 10:00:00 1 g via INTRAVENOUS
  Filled 2020-07-19: qty 10

## 2020-07-19 MED ORDER — DULOXETINE HCL 30 MG PO CPEP
30.0000 mg | ORAL_CAPSULE | Freq: Every day | ORAL | Status: DC
  Administered 2020-07-19 – 2020-07-26 (×8): 30 mg via ORAL
  Filled 2020-07-19 (×8): qty 1

## 2020-07-19 MED ORDER — ACETAMINOPHEN 325 MG PO TABS
650.0000 mg | ORAL_TABLET | Freq: Once | ORAL | Status: AC
  Administered 2020-07-19: 08:00:00 650 mg via ORAL
  Filled 2020-07-19: qty 2

## 2020-07-19 MED ORDER — OXYCODONE HCL 5 MG PO TABS
5.0000 mg | ORAL_TABLET | ORAL | Status: DC | PRN
  Administered 2020-07-20 – 2020-07-24 (×7): 5 mg via ORAL
  Filled 2020-07-19 (×7): qty 1

## 2020-07-19 MED ORDER — POLYETHYLENE GLYCOL 3350 17 G PO PACK
17.0000 g | PACK | Freq: Every day | ORAL | Status: DC | PRN
  Administered 2020-07-23: 01:00:00 17 g via ORAL
  Filled 2020-07-19: qty 1

## 2020-07-19 MED ORDER — HYDROXYZINE HCL 25 MG PO TABS: 25.0000 mg | ORAL_TABLET | Freq: Every day | ORAL | Status: DC | PRN

## 2020-07-19 MED ORDER — HYDROMORPHONE HCL 1 MG/ML IJ SOLN
0.5000 mg | INTRAMUSCULAR | Status: AC
  Administered 2020-07-19: 09:00:00 0.5 mg via INTRAVENOUS
  Filled 2020-07-19: qty 1

## 2020-07-19 MED ORDER — ENOXAPARIN SODIUM 40 MG/0.4ML ~~LOC~~ SOLN
40.0000 mg | SUBCUTANEOUS | Status: DC
  Administered 2020-07-19 – 2020-07-25 (×5): 40 mg via SUBCUTANEOUS
  Filled 2020-07-19 (×6): qty 0.4

## 2020-07-19 MED ORDER — AZITHROMYCIN 250 MG PO TABS
500.0000 mg | ORAL_TABLET | Freq: Once | ORAL | Status: AC
  Administered 2020-07-19: 10:00:00 500 mg via ORAL
  Filled 2020-07-19: qty 2

## 2020-07-19 MED ORDER — SODIUM CHLORIDE 0.9% FLUSH: 9.0000 mL | INTRAVENOUS | Status: DC | PRN

## 2020-07-19 MED ORDER — HYDROMORPHONE 1 MG/ML IV SOLN
INTRAVENOUS | Status: DC
Start: 2020-07-19 — End: 2020-07-24
  Administered 2020-07-19: 0.6 mg via INTRAVENOUS
  Administered 2020-07-19: 1.1 mg via INTRAVENOUS
  Administered 2020-07-19: 30 mg via INTRAVENOUS
  Administered 2020-07-20: 1.8 mg via INTRAVENOUS
  Administered 2020-07-20: 0.9 mg via INTRAVENOUS
  Administered 2020-07-20 (×2): 0.6 mg via INTRAVENOUS
  Administered 2020-07-20: 1.2 mg via INTRAVENOUS
  Administered 2020-07-20 (×2): 0.9 mg via INTRAVENOUS
  Administered 2020-07-21: 1.2 mg via INTRAVENOUS
  Administered 2020-07-21: 1.8 mg via INTRAVENOUS
  Administered 2020-07-21: 1.5 mg via INTRAVENOUS
  Administered 2020-07-21: 1.2 mg via INTRAVENOUS
  Administered 2020-07-21 (×2): 1 mg via INTRAVENOUS
  Administered 2020-07-22: 30 mg via INTRAVENOUS
  Administered 2020-07-22: 1 mg via INTRAVENOUS
  Administered 2020-07-22: 3 mg via INTRAVENOUS
  Administered 2020-07-22: 1 mg via INTRAVENOUS
  Administered 2020-07-22: 0 mg via INTRAVENOUS
  Administered 2020-07-23: 1 mg via INTRAVENOUS
  Administered 2020-07-23: 1.2 mg via INTRAVENOUS
  Administered 2020-07-23: 1.8 mg via INTRAVENOUS
  Administered 2020-07-23: 1.2 mg via INTRAVENOUS
  Administered 2020-07-23: 3 mg via INTRAVENOUS
  Administered 2020-07-23: 1 mL via INTRAVENOUS
  Administered 2020-07-23: 1.2 mg via INTRAVENOUS
  Administered 2020-07-24: 1.8 mg via INTRAVENOUS
  Administered 2020-07-24: 1.5 mg via INTRAVENOUS
  Administered 2020-07-24: 1.2 mg via INTRAVENOUS
  Filled 2020-07-19 (×2): qty 30

## 2020-07-19 MED ORDER — HYDROMORPHONE HCL 1 MG/ML IJ SOLN
1.0000 mg | INTRAMUSCULAR | Status: AC
  Filled 2020-07-19: qty 1

## 2020-07-19 MED ORDER — ONDANSETRON HCL 4 MG/2ML IJ SOLN
4.0000 mg | INTRAMUSCULAR | Status: DC | PRN
  Administered 2020-07-19: 09:00:00 4 mg via INTRAVENOUS
  Filled 2020-07-19: qty 2

## 2020-07-19 MED ORDER — SODIUM CHLORIDE 0.9 % IV BOLUS
1000.0000 mL | Freq: Once | INTRAVENOUS | Status: AC
  Administered 2020-07-19: 08:00:00 1000 mL via INTRAVENOUS

## 2020-07-19 MED ORDER — ALBUTEROL SULFATE HFA 108 (90 BASE) MCG/ACT IN AERS: 2.0000 | INHALATION_SPRAY | Freq: Four times a day (QID) | RESPIRATORY_TRACT | Status: DC | PRN

## 2020-07-19 MED ORDER — ONDANSETRON HCL 4 MG/2ML IJ SOLN: 4.0000 mg | Freq: Four times a day (QID) | INTRAMUSCULAR | Status: DC | PRN

## 2020-07-19 MED ORDER — SODIUM CHLORIDE 0.9 % IV BOLUS
1000.0000 mL | Freq: Once | INTRAVENOUS | Status: AC
  Administered 2020-07-19: 09:00:00 1000 mL via INTRAVENOUS

## 2020-07-19 MED ORDER — KETOROLAC TROMETHAMINE 15 MG/ML IJ SOLN
15.0000 mg | Freq: Four times a day (QID) | INTRAMUSCULAR | Status: AC
  Administered 2020-07-19 – 2020-07-24 (×20): 15 mg via INTRAVENOUS
  Filled 2020-07-19 (×19): qty 1

## 2020-07-19 MED ORDER — DIPHENHYDRAMINE HCL 12.5 MG/5ML PO ELIX
12.5000 mg | ORAL_SOLUTION | Freq: Four times a day (QID) | ORAL | Status: DC | PRN
  Administered 2020-07-21 – 2020-07-22 (×2): 12.5 mg via ORAL
  Filled 2020-07-19 (×3): qty 5

## 2020-07-19 MED ORDER — NALOXONE HCL 0.4 MG/ML IJ SOLN: 0.4000 mg | INTRAMUSCULAR | Status: DC | PRN

## 2020-07-19 MED ORDER — SENNOSIDES-DOCUSATE SODIUM 8.6-50 MG PO TABS
1.0000 | ORAL_TABLET | Freq: Two times a day (BID) | ORAL | Status: DC
  Administered 2020-07-20 – 2020-07-26 (×11): 1 via ORAL
  Filled 2020-07-19 (×12): qty 1

## 2020-07-19 MED ORDER — SODIUM CHLORIDE 0.45 % IV SOLN: INTRAVENOUS | Status: AC

## 2020-07-19 NOTE — ED Notes (Signed)
Attempted to call report °

## 2020-07-19 NOTE — ED Notes (Signed)
[facility] here to transport pt to [place].  

## 2020-07-19 NOTE — ED Triage Notes (Addendum)
Patient arrives to ED with c/o generalized pain x2 days. Pt states pain is acute and describes it as throbbing. Pt has hx of sickle cell and is concerned its a crisis. Pt febrile and tachycardic in triage.

## 2020-07-19 NOTE — ED Provider Notes (Signed)
[facility] EMERGENCY DEPARTMENT Provider Note   CSN: [id number] Arrival date & time: 07/19/20  3748     History Chief Complaint  Patient presents with  . Sickle Cell Pain Crisis    Shelby Coffey is a 19 y.o. female.  HPI   Patient presents to the ED for evaluation of generalized body aches and pains similar to her previous sickle cell crisis.  Patient has history of sickle cell anemia.  Patient states generally she does not have frequent attacks.  She is not on any regular medications.  She is currently not seeing a sickle cell doctor.  Patient started having the symptoms 2 days ago.  She is having diffuse body aches and pain.  She also started to have some discomfort in her chest.  She denies any coughing but this morning she also developed a fever.  She denies any vomiting or diarrhea.  No dysuria.  No sore throat.  Patient has been vaccinated for covid but has not had her booster yet.  No known ill contacts.  Past Medical History:  Diagnosis Date  . Asthma   . Scoliosis   . Sickle cell anemia [facility])     Patient Active Problem List   Diagnosis Date Noted  . Severe recurrent major depression without psychotic features (HCC) 09/04/2017  . Acute chest syndrome (HCC) 02/08/2017  . Sickle cell crisis (HCC) 02/06/2017  . Fever 02/06/2017  . Leukocytosis 02/06/2017  . Scoliosis 07/07/2013    History reviewed. No pertinent surgical history.   OB History    Gravida  0   Para  0   Term  0   Preterm  0   AB  0   Living  0     SAB  0   IAB  0   Ectopic  0   Multiple  0   Live Births  0           Family History  Problem Relation Age of Onset  . Breast cancer Mother   . Sickle cell trait Sister   . Diabetes type II Maternal Grandmother     Social History   Tobacco Use  . Smoking status: Never Smoker  . Smokeless tobacco: Never Used  Substance Use Topics  . Alcohol use: No  . Drug use: Yes    Types: Marijuana    Comment: less  than once a week    Home Medications Prior to Admission medications   Medication Sig Start Date End Date Taking? Authorizing Provider  acetaminophen (TYLENOL) 325 MG tablet Take 325-650 mg by mouth every 6 (six) hours as needed (for pain or headaches).   Yes [provider]  albuterol (PROVENTIL HFA;VENTOLIN HFA) 108 (90 Base) MCG/ACT inhaler Inhale 2 puffs into the lungs every 6 (six) hours as needed for wheezing or shortness of breath.   Yes [provider]  amphetamine-dextroamphetamine (ADDERALL) 15 MG tablet Take 15 mg by mouth daily.   Yes [provider]  DULoxetine (CYMBALTA) 30 MG capsule Take 30 mg by mouth daily.   Yes [provider]  hydrOXYzine (ATARAX/VISTARIL) 25 MG tablet Take 25 mg by mouth daily as needed for anxiety.   Yes [provider]  cephALEXin (KEFLEX) 500 MG capsule Take 1 capsule (500 mg total) by mouth 2 (two) times daily. Patient not taking: No sig reported 08/21/19   Bennie Pierini, FNP  etonogestrel (NEXPLANON) 68 MG IMPL implant 68 mg by Subdermal route once.    [provider]  ibuprofen (ADVIL) 800 MG tablet Take 1 tablet (800 mg total) by mouth 3 (three) times daily. Patient not taking: Reported on 07/19/2020 03/20/20   Sharene Skeans, MD  metroNIDAZOLE (FLAGYL) 500 MG tablet Take 1 tablet (500 mg total) by mouth 2 (two) times daily. Patient not taking: Reported on 07/19/2020 08/21/19   Bennie Pierini, FNP  oxyCODONE (OXY IR/ROXICODONE) 5 MG immediate release tablet Take 1 tablet (5 mg total) by mouth every 4 (four) hours as needed for severe pain. Patient not taking: No sig reported 07/02/17   Ree Shay, MD    Allergies    Patient has no known allergies.  Review of Systems   Review of Systems  All other systems reviewed and are negative.   Physical Exam Updated Vital Signs BP 118/74   Pulse 95   Temp 99.6 F (37.6 C) (Oral)   Resp 17   Ht 1.702 m (5\' 7" )   Wt 62.6 kg   SpO2 95%    BMI 21.61 kg/m   Physical Exam Vitals and nursing note reviewed.  Constitutional:      General: She is not in acute distress.    Appearance: She is well-developed and well-nourished.  HENT:     Head: Normocephalic and atraumatic.     Right Ear: External ear normal.     Left Ear: External ear normal.  Eyes:     General: No scleral icterus.       Right eye: No discharge.        Left eye: No discharge.     Conjunctiva/sclera: Conjunctivae normal.  Neck:     Trachea: No tracheal deviation.  Cardiovascular:     Rate and Rhythm: Regular rhythm. Tachycardia present.     Pulses: Intact distal pulses.  Pulmonary:     Effort: Pulmonary effort is normal. No respiratory distress.     Breath sounds: Normal breath sounds. No stridor. No wheezing or rales.  Abdominal:     General: Bowel sounds are normal. There is no distension.     Palpations: Abdomen is soft.     Tenderness: There is no abdominal tenderness. There is no guarding or rebound.  Musculoskeletal:        General: No tenderness or edema.     Cervical back: Neck supple.  Skin:    General: Skin is warm and dry.     Findings: No rash.  Neurological:     Mental Status: She is alert.     Cranial Nerves: No cranial nerve deficit (no facial droop, extraocular movements intact, no slurred speech).     Sensory: No sensory deficit.     Motor: No abnormal muscle tone or seizure activity.     Coordination: Coordination normal.     Deep Tendon Reflexes: Strength normal.  Psychiatric:        Mood and Affect: Mood and affect normal.     ED Results / Procedures / Treatments   Labs (all labs ordered are listed, but only abnormal results are displayed) Labs Reviewed  COMPREHENSIVE METABOLIC PANEL - Abnormal; Notable for the following components:      Result Value   Potassium 2.9 (*)    CO2 21 (*)    Glucose, Bld 143 (*)    BUN <5 (*)    Calcium 8.7 (*)    Albumin 3.4 (*)    AST 81 (*)    ALT 49 (*)    All other components  within normal limits  CBC WITH DIFFERENTIAL/PLATELET -  Abnormal; Notable for the following components:   RBC 3.78 (*)    Hemoglobin 10.6 (*)    HCT 29.2 (*)    MCV 77.2 (*)    MCHC 36.3 (*)    nRBC 1.0 (*)    All other components within normal limits  RETICULOCYTES - Abnormal; Notable for the following components:   Retic Ct Pct 4.0 (*)    RBC. 3.68 (*)    Immature Retic Fract 28.0 (*)    All other components within normal limits  LACTIC ACID, PLASMA - Abnormal; Notable for the following components:   Lactic Acid, Venous 3.1 (*)    All other components within normal limits  RESP PANEL BY RT-PCR (FLU A&B, COVID) ARPGX2  CULTURE, BLOOD (ROUTINE X 2)  CULTURE, BLOOD (ROUTINE X 2)  URINE CULTURE  LIPASE, BLOOD  URINALYSIS, ROUTINE W REFLEX MICROSCOPIC  LACTIC ACID, PLASMA  I-STAT BETA HCG BLOOD, ED ([facility], [facility], [facility] ONLY)    EKG EKG Interpretation  Date/Time:  Friday July 19 2020 07:24:16 EST Ventricular Rate:  130 PR Interval:  148 QRS Duration: 72 QT Interval:  284 QTC Calculation: 417 R Axis:   53 Text Interpretation: Sinus tachycardia Otherwise normal ECG No significant change since last tracing Confirmed by Linwood Dibbles [phone]) on 07/19/2020 8:16:22 AM   Radiology DG Chest Portable 1 View  Result Date: 07/19/2020 CLINICAL DATA:  Sickle cell crisis, BILATERAL arm and leg pain, fever, chest tightness with expiration EXAM: PORTABLE CHEST 1 VIEW COMPARISON:  Portable exam 0749 hours compared 03/19/2020 FINDINGS: Normal heart size, mediastinal contours, and pulmonary vascularity. Lungs clear. No acute infiltrate, pleural effusion, or pneumothorax. Dextroconvex thoracic scoliosis. IMPRESSION: No acute abnormalities. Electronically Signed   By: Ulyses Southward M.D.   On: 07/19/2020 08:16    Procedures Procedures (including critical care time)  Medications Ordered in ED Medications  HYDROmorphone (DILAUDID) injection 1 mg (has no administration in time range)  diphenhydrAMINE  (BENADRYL) capsule 25-50 mg (25 mg Oral Given 07/19/20 0910)  ondansetron (ZOFRAN) injection 4 mg (4 mg Intravenous Given 07/19/20 0911)  cefTRIAXone (ROCEPHIN) 1 g in sodium chloride 0.9 % 100 mL IVPB (has no administration in time range)  azithromycin (ZITHROMAX) tablet 500 mg (has no administration in time range)  sodium chloride 0.9 % bolus 1,000 mL (0 mLs Intravenous Stopped 07/19/20 0909)  acetaminophen (TYLENOL) tablet 650 mg (650 mg Oral Given 07/19/20 0755)  sodium chloride 0.9 % bolus 1,000 mL (1,000 mLs Intravenous New Bag/Given 07/19/20 0851)  HYDROmorphone (DILAUDID) injection 0.5 mg (0.5 mg Intravenous Given 07/19/20 0913)    ED Course  I have reviewed the triage vital signs and the nursing notes.  Pertinent labs & imaging results that were available during my care of the patient were reviewed by me and considered in my medical decision making (see chart for details).  Clinical Course as of 07/19/20 1109  Fri Jul 19, 2020  1014 Labs show elevated lactic acid level. [JK]  1015 Chest x-ray without evidence of infiltrate [JK]  1015 Covid and flu test are negative [JK]  1015 Lactic acid level is elevated.  We will give a dose of empiric antibiotics [JK]  1108 Discussed with [place] Hollis.  Will admit to sickle cell service, at [facility] [JK]    Clinical Course User Index [JK] Linwood Dibbles, MD   MDM Rules/Calculators/A&P                          Patient presented  to the ED for evaluation of sickle cell pain crisis as well as fever.  Symptoms concerning also for the possibility of acute chest syndrome with her complaints of chest pain and fever.  Chest x-ray does not show any signs of pneumonia or infiltrate.  Patient was also tested for influenza and covid.  Those are negative.  Patient was initially tachycardic and febrile.  Vital signs have improved.  Blood pressure stable.  Patient does have a lactic acidosis.  No hypotension at this time.  Doubt evolving sepsis but will give a dose of  empiric antibiotics and continue to monitor.  With her fever and sickle cell pain crisis I will consult with the sickle cell service for admission and further treatment Final Clinical Impression(s) / ED Diagnoses Final diagnoses:  Sickle cell pain crisis (HCC)  Febrile illness     Linwood DibblesKnapp, Stacy Sailer, MD 07/19/20 1023

## 2020-07-19 NOTE — Progress Notes (Signed)
Called the house number on amnion to see who was covering for sickle patients and no one returned the call. Patient's potassium was 2.9 from 0800. Will report this to the night nurse

## 2020-07-19 NOTE — H&P (Addendum)
H&P  Patient Demographics:  Shelby Coffey, is a 19 y.o. female  MRN: [id number]   DOB - January 15, 2001  Admit Date - 07/19/2020  Outpatient Primary MD for the patient is Genene Churn, MD  Chief Complaint  Patient presents with  . Sickle Cell Pain Crisis      HPI:   Shelby Coffey  is a 20 y.o. female with a medical history significant for sickle cell disease type Shelby Coffey presented to the emergency department with complaints of generalized body aches and pains that are similar to her previous sickle cell crisis.  Patient typically has very well-controlled sickle cell disease with infrequent pain crisis.  She says that she is not on any regular pain medications.  Patient is opiate nave.  She has been attempting to manage at home with Tylenol without success.  Patient attributes pain crisis to celebrating her birthday several days ago.  Patient states that she felt dehydrated following her birthday party.  Patient endorses chest pain, bilateral upper and lower extremity pain, and low back pain.  Pain intensity is 6/10 characterized as intermittent and aching.  Patient denies any headache, chest pain, shortness of breath, urinary symptoms, nausea, vomiting, or diarrhea.  No recent travel, sick contacts, or exposure to COVID-19.  ER course: While at [facility] emergency department vital signs were: Temperature 102.9, pulse rate 122, respirations 16, blood pressure 119/80, and oxygen saturation 98% on room air.  Chest x-ray showed no acute cardiopulmonary process.  Rocephin and oral azithromycin initiated.  Lactic acid elevated at 3.1.  Respiratory panel unremarkable.  Lipase negative.  Urinalysis unremarkable.  Beta hCG negative.  WBCs 8.3.  Hemoglobin 10.6, consistent with patient's baseline.  Complete metabolic panel shows potassium of 2.9, runs of potassium initiated.  Creatinine 0.78, AST 81, ALT 49.  Sickle cell team notified for admission.  Patient admitted for fever of unknown origin in the  setting of sickle cell pain crisis.   Review of systems:  In addition to the HPI above, patient reports No fever or chills No Headache, No changes with vision or hearing No problems swallowing food or liquids No chest pain, cough or shortness of breath No abdominal pain, No nausea or vomiting, Bowel movements are regular No blood in stool or urine No dysuria No new skin rashes or bruises No new joints pains-aches No new weakness, tingling, numbness in any extremity No recent weight gain or loss No polyuria, polydypsia or polyphagia No significant Mental Stressors   With Past History of the following :   Past Medical History:  Diagnosis Date  . Asthma   . Scoliosis   . Sickle cell anemia (HCC)       History reviewed. No pertinent surgical history.   Social History:   Social History   Tobacco Use  . Smoking status: Never Smoker  . Smokeless tobacco: Never Used  Substance Use Topics  . Alcohol use: No     Lives - At home   Family History :   Family History  Problem Relation Age of Onset  . Breast cancer Mother   . Sickle cell trait Sister   . Diabetes type II Maternal Grandmother      Home Medications:   Prior to Admission medications   Medication Sig Start Date End Date Taking? Authorizing Provider  acetaminophen (TYLENOL) 325 MG tablet Take 325-650 mg by mouth every 6 (six) hours as needed (for pain or headaches).   Yes [provider]  albuterol (PROVENTIL HFA;VENTOLIN HFA) 108 (  90 Base) MCG/ACT inhaler Inhale 2 puffs into the lungs every 6 (six) hours as needed for wheezing or shortness of breath.   Yes [provider]  amphetamine-dextroamphetamine (ADDERALL) 15 MG tablet Take 15 mg by mouth daily.   Yes [provider]  DULoxetine (CYMBALTA) 30 MG capsule Take 30 mg by mouth daily.   Yes [provider]  hydrOXYzine (ATARAX/VISTARIL) 25 MG tablet Take 25 mg by mouth daily as needed for anxiety.   Yes [provider]  etonogestrel (NEXPLANON) 68 MG IMPL implant 68 mg by Subdermal route once.    [provider]  ibuprofen (ADVIL) 800 MG tablet Take 1 tablet (800 mg total) by mouth 3 (three) times daily. Patient not taking: Reported on 07/19/2020 03/20/20   Sharene Skeans, MD  oxyCODONE (OXY IR/ROXICODONE) 5 MG immediate release tablet Take 1 tablet (5 mg total) by mouth every 4 (four) hours as needed for severe pain. Patient not taking: No sig reported 07/02/17   Ree Shay, MD     Allergies:   No Known Allergies   Physical Exam:   Vitals:   Vitals:   07/19/20 1344 07/19/20 1421  BP: 106/70   Pulse: 77   Resp: 16 20  Temp: 98 F (36.7 C)   SpO2: 98% 100%    Physical Exam: Constitutional: Patient appears well-developed and well-nourished. Not in obvious distress. HENT: Normocephalic, atraumatic, External right and left ear normal. Oropharynx is clear and moist.  Eyes: Conjunctivae and EOM are normal. PERRLA, no scleral icterus. Neck: Normal ROM. Neck supple. No JVD. No tracheal deviation. No thyromegaly. CVS: RRR, S1/S2 +, no murmurs, no gallops, no carotid bruit.  Pulmonary: Effort and breath sounds normal, no stridor, rhonchi, wheezes, rales.  Abdominal: Soft. BS +, no distension, tenderness, rebound or guarding.  Musculoskeletal: Normal range of motion. No edema and no tenderness.  Lymphadenopathy: No lymphadenopathy noted, cervical, inguinal or axillary Neuro: Alert. Normal reflexes, muscle tone coordination. No cranial nerve deficit. Skin: Skin is warm and dry. No rash noted. Not diaphoretic. No erythema. No pallor. Psychiatric: Normal mood and affect. Behavior, judgment, thought content normal.   Data Review:   CBC Recent Labs  Lab 07/19/20 0804  WBC 8.3  HGB 10.6*  HCT 29.2*  PLT 327  MCV 77.2*  MCH 28.0  MCHC 36.3*  RDW 15.0  LYMPHSABS 1.7  MONOABS 0.6  EOSABS 0.3  BASOSABS 0.1    ------------------------------------------------------------------------------------------------------------------  Chemistries  Recent Labs  Lab 07/19/20 0804  NA 136  K 2.9*  CL 104  CO2 21*  GLUCOSE 143*  BUN <5*  CREATININE 0.78  CALCIUM 8.7*  AST 81*  ALT 49*  ALKPHOS 50  BILITOT 1.1   ------------------------------------------------------------------------------------------------------------------ estimated creatinine clearance is 109.1 mL/min (by C-G formula based on SCr of 0.78 mg/dL). ------------------------------------------------------------------------------------------------------------------ No results for input(s): TSH, T4TOTAL, T3FREE, THYROIDAB in the last 72 hours.  Invalid input(s): FREET3  Coagulation profile No results for input(s): INR, PROTIME in the last 168 hours. ------------------------------------------------------------------------------------------------------------------- No results for input(s): DDIMER in the last 72 hours. -------------------------------------------------------------------------------------------------------------------  Cardiac Enzymes No results for input(s): CKMB, TROPONINI, MYOGLOBIN in the last 168 hours.  Invalid input(s): CK ------------------------------------------------------------------------------------------------------------------ No results found for: BNP  ---------------------------------------------------------------------------------------------------------------  Urinalysis    Component Value Date/Time   COLORURINE YELLOW 07/19/2020 1115   APPEARANCEUR CLEAR 07/19/2020 1115   LABSPEC 1.006 07/19/2020 1115   PHURINE 6.0 07/19/2020 1115   GLUCOSEU NEGATIVE 07/19/2020 1115   HGBUR NEGATIVE 07/19/2020 1115   BILIRUBINUR NEGATIVE  07/19/2020 1115   KETONESUR NEGATIVE 07/19/2020 1115   PROTEINUR NEGATIVE 07/19/2020 1115   UROBILINOGEN 1.0 03/02/2008 1932   NITRITE NEGATIVE 07/19/2020 1115    LEUKOCYTESUR NEGATIVE 07/19/2020 1115    ----------------------------------------------------------------------------------------------------------------   Imaging Results:    DG Chest Portable 1 View  Result Date: 07/19/2020 CLINICAL DATA:  Sickle cell crisis, BILATERAL arm and leg pain, fever, chest tightness with expiration EXAM: PORTABLE CHEST 1 VIEW COMPARISON:  Portable exam 0749 hours compared 03/19/2020 FINDINGS: Normal heart size, mediastinal contours, and pulmonary vascularity. Lungs clear. No acute infiltrate, pleural effusion, or pneumothorax. Dextroconvex thoracic scoliosis. IMPRESSION: No acute abnormalities. Electronically Signed   By: Ulyses Southward M.D.   On: 07/19/2020 08:16     Assessment & Plan:  Principal Problem:   Sickle cell pain crisis (HCC) Active Problems:   Fever of unknown origin   Transaminitis   Hypokalemia  Sickle cell disease with pain crisis, type [place]: Admit patient to MedSurg.  Initiate IV fluids, 0.45% saline at 100 mL/h. Patient is opiate nave, initiate IV Dilaudid PCA per full dose. Oxycodone 5 mg every 4 hours as needed for severe breakthrough pain. Toradol 15 mg IV every 6 hours for total of 5 days Monitor vital signs very closely, reevaluate pain scale regularly, and supplemental oxygen as needed. Patient will be reevaluated for pain in the context of function and relationship to baseline as care progresses.  Sickle cell anemia: Hemoglobin is 10.6, consistent with patient's baseline.  There is no clinical indication for blood transfusion at this time.  Continue to follow closely.  Hypokalemia: Potassium 2.9.  Replete.  Follow labs in a.m.  History of depression: Continue Cymbalta 30 mg daily.  No suicidal homicidal ideations noted.  Continue to follow closely.  Transaminitis: ALT and AST elevated.  Considered to be secondary to vaso-occlusive pain crisis.  Continue hydration.  Follow closely.  Hepatic function panel in a.m.  Fever of  unknown origin: Department Temperature was 102.9.  IV ceftriaxone oral azithromycin were initiated.  Blood cultures negative so far.  COVID-19 negative.  Patient currently afebrile.  Continue to follow closely.   DVT Prophylaxis: Subcut Lovenox and SCDs  AM Labs Ordered, also please review Full Orders  Family Communication: Admission, patient's condition and plan of care including tests being ordered have been discussed with the patient who indicate understanding and agree with the plan and Code Status.  Code Status: Full Code  Consults called: None    Admission status: Inpatient    Time spent in minutes : 35 minutes  Nolon Nations  APRN, MSN, FNP-C Patient Care [facility] [address] [place], [place] [address] [phone]  07/19/2020 at 3:15 PM

## 2020-07-20 ENCOUNTER — Inpatient Hospital Stay (HOSPITAL_COMMUNITY): Payer: Medicaid Other

## 2020-07-20 LAB — CBC
HCT: 28.2 % — ABNORMAL LOW (ref 36.0–46.0)
Hemoglobin: 10.5 g/dL — ABNORMAL LOW (ref 12.0–15.0)
MCH: 28.8 pg (ref 26.0–34.0)
MCHC: 37.2 g/dL — ABNORMAL HIGH (ref 30.0–36.0)
MCV: 77.5 fL — ABNORMAL LOW (ref 80.0–100.0)
Platelets: 297 10*3/uL (ref 150–400)
RBC: 3.64 MIL/uL — ABNORMAL LOW (ref 3.87–5.11)
RDW: 14.9 % (ref 11.5–15.5)
WBC: 10.3 10*3/uL (ref 4.0–10.5)
nRBC: 0.9 % — ABNORMAL HIGH (ref 0.0–0.2)

## 2020-07-20 LAB — URINE CULTURE: Culture: <10000 — AB

## 2020-07-20 MED ORDER — OXYCODONE HCL 5 MG PO TABS: 5.0000 mg | ORAL_TABLET | ORAL | 0 refills | Status: DC | PRN

## 2020-07-20 MED ORDER — IBUPROFEN 800 MG PO TABS: 800.0000 mg | ORAL_TABLET | Freq: Three times a day (TID) | ORAL | 0 refills | Status: DC

## 2020-07-20 MED ORDER — INFLUENZA VAC SPLIT QUAD 0.5 ML IM SUSY
0.5000 mL | PREFILLED_SYRINGE | INTRAMUSCULAR | Status: AC
  Administered 2020-07-21: 05:00:00 0.5 mL via INTRAMUSCULAR
  Filled 2020-07-20: qty 0.5

## 2020-07-20 MED ORDER — SODIUM CHLORIDE 0.45 % IV SOLN
INTRAVENOUS | Status: AC
  Administered 2020-07-20: 14:00:00 600 mL via INTRAVENOUS

## 2020-07-20 MED ORDER — PNEUMOCOCCAL VAC POLYVALENT 25 MCG/0.5ML IJ INJ
0.5000 mL | INJECTION | INTRAMUSCULAR | Status: AC
  Administered 2020-07-21: 05:00:00 0.5 mL via INTRAMUSCULAR
  Filled 2020-07-20: qty 0.5

## 2020-07-20 MED ORDER — SODIUM CHLORIDE 0.9 % IV SOLN
1.0000 g | INTRAVENOUS | Status: DC
  Administered 2020-07-20 – 2020-07-21 (×2): 1 g via INTRAVENOUS
  Filled 2020-07-20 (×2): qty 1

## 2020-07-20 MED ORDER — FOLIC ACID 1 MG PO TABS
1.0000 mg | ORAL_TABLET | Freq: Every day | ORAL | Status: DC
  Administered 2020-07-20 – 2020-07-26 (×7): 1 mg via ORAL
  Filled 2020-07-20 (×7): qty 1

## 2020-07-20 MED ORDER — POTASSIUM CHLORIDE CRYS ER 20 MEQ PO TBCR
40.0000 meq | EXTENDED_RELEASE_TABLET | Freq: Once | ORAL | Status: AC
  Administered 2020-07-20: 15:00:00 40 meq via ORAL
  Filled 2020-07-20: qty 2

## 2020-07-20 NOTE — Progress Notes (Signed)
Subjective: Shelby Coffey is a 19 year old female with a medical history significant for sickle cell disease was admitted for sickle cell pain crisis.  Patient continues to have generalized pain. She says that pain is mostly in her joints. Pain intensity is 6/10. Patient also endorses headache.  She denies chest pain, shortness of breath, dizziness, urinary symptoms, nausea, vomiting, or diarrhea.   Objective:  Vital signs in last 24 hours:  Vitals:   07/20/20 1010 07/20/20 1207 07/20/20 1242 07/20/20 1246  BP: 117/74 104/64    Pulse: (!) 101 92    Resp: 17 19  20   Temp: (!) 101.6 F (38.7 C) 98 F (36.7 C) 100.2 F (37.9 C)   TempSrc: Oral Axillary Oral   SpO2: 95% 97%  98%  Weight:      Height:        Intake/Output from previous day:   Intake/Output Summary (Last 24 hours) at 07/20/2020 1312 Last data filed at 07/20/2020 0500 Gross per 24 hour  Intake 1621.97 ml  Output 900 ml  Net 721.97 ml    Physical Exam: General: Alert, awake, oriented x3, in no acute distress.  HEENT: /AT PEERL, EOMI Neck: Trachea midline,  no masses, no thyromegal,y no JVD, no carotid bruit OROPHARYNX:  Moist, No exudate/ erythema/lesions.  Heart: Regular rate and rhythm, without murmurs, rubs, gallops, PMI non-displaced, no heaves or thrills on palpation.  Lungs: Clear to auscultation, no wheezing or rhonchi noted. No increased vocal fremitus resonant to percussion  Abdomen: Soft, nontender, nondistended, positive bowel sounds, no masses no hepatosplenomegaly noted..  Neuro: No focal neurological deficits noted cranial nerves II through XII grossly intact. DTRs 2+ bilaterally upper and lower extremities. Strength 5 out of 5 in bilateral upper and lower extremities. Musculoskeletal: No warm swelling or erythema around joints, no spinal tenderness noted. Psychiatric: Patient alert and oriented x3, good insight and cognition, good recent to remote recall. Lymph node survey: No cervical axillary  or inguinal lymphadenopathy noted.  Lab Results:  Basic Metabolic Panel:    Component Value Date/Time   NA 137 07/19/2020 2133   K 3.2 (L) 07/19/2020 2133   CL 107 07/19/2020 2133   CO2 22 07/19/2020 2133   BUN <5 (L) 07/19/2020 2133   CREATININE 0.51 07/19/2020 2133   GLUCOSE 102 (H) 07/19/2020 2133   CALCIUM 8.0 (L) 07/19/2020 2133   CBC:    Component Value Date/Time   WBC 10.3 07/20/2020 0601   HGB 10.5 (L) 07/20/2020 0601   HCT 28.2 (L) 07/20/2020 0601   PLT 297 07/20/2020 0601   MCV 77.5 (L) 07/20/2020 0601   NEUTROABS 5.6 07/19/2020 0804   LYMPHSABS 1.7 07/19/2020 0804   MONOABS 0.6 07/19/2020 0804   EOSABS 0.3 07/19/2020 0804   BASOSABS 0.1 07/19/2020 0804    Recent Results (from the past 240 hour(s))  Blood culture (routine x 2)     Status: None (Preliminary result)   Collection Time: 07/19/20  8:05 AM   Specimen: BLOOD RIGHT ARM  Result Value Ref Range Status   Specimen Description BLOOD RIGHT ARM  Final   Special Requests   Final    BOTTLES DRAWN AEROBIC AND ANAEROBIC Blood Culture adequate volume   Culture   Final    NO GROWTH 1 DAY Performed at [facility], [address] [place], [place]    Report Status PENDING  Incomplete  Resp Panel by RT-PCR (Flu A&B, Covid) Nasopharyngeal Swab     Status: None  Collection Time: 07/19/20  8:11 AM   Specimen: Nasopharyngeal Swab; Nasopharyngeal(NP) swabs in vial transport medium  Result Value Ref Range Status   SARS Coronavirus 2 by RT PCR NEGATIVE NEGATIVE Final    Comment: (NOTE) SARS-CoV-2 target nucleic acids are NOT DETECTED.  The SARS-CoV-2 RNA is generally detectable in upper respiratory specimens during the acute phase of infection. The lowest concentration of SARS-CoV-2 viral copies this assay can detect is 138 copies/mL. A negative result does not preclude SARS-Cov-2 infection and should not be used as the sole basis for treatment or other patient management decisions. A negative  result may occur with  improper specimen collection/handling, submission of specimen other than nasopharyngeal swab, presence of viral mutation(s) within the areas targeted by this assay, and inadequate number of viral copies(<138 copies/mL). A negative result must be combined with clinical observations, patient history, and epidemiological information. The expected result is Negative.  Fact Sheet for Patients:  BloggerCourse.com  Fact Sheet for Healthcare Providers:  SeriousBroker.it  This test is no t yet approved or cleared by the [place] FDA and  has been authorized for detection and/or diagnosis of SARS-CoV-2 by FDA under an Emergency Use Authorization (EUA). This EUA will remain  in effect (meaning this test can be used) for the duration of the COVID-19 declaration under Section 564(b)(1) of the Act, 21 U.S.C.section 360bbb-3(b)(1), unless the authorization is terminated  or revoked sooner.       Influenza A by PCR NEGATIVE NEGATIVE Final   Influenza B by PCR NEGATIVE NEGATIVE Final    Comment: (NOTE) The Xpert Xpress SARS-CoV-2/FLU/RSV plus assay is intended as an aid in the diagnosis of influenza from Nasopharyngeal swab specimens and should not be used as a sole basis for treatment. Nasal washings and aspirates are unacceptable for Xpert Xpress SARS-CoV-2/FLU/RSV testing.  Fact Sheet for Patients: BloggerCourse.com  Fact Sheet for Healthcare Providers: SeriousBroker.it  This test is not yet approved or cleared by the [place] FDA and has been authorized for detection and/or diagnosis of SARS-CoV-2 by FDA under an Emergency Use Authorization (EUA). This EUA will remain in effect (meaning this test can be used) for the duration of the COVID-19 declaration under Section 564(b)(1) of the Act, 21 U.S.C. section 360bbb-3(b)(1), unless the authorization is  terminated or revoked.  Performed at [facility], [address] [place], [place] [address]   Urine culture     Status: Abnormal   Collection Time: 07/19/20 11:15 AM   Specimen: Urine, Random  Result Value Ref Range Status   Specimen Description URINE, RANDOM  Final   Special Requests NONE  Final   Culture (A)  Final    <10,000 COLONIES/mL INSIGNIFICANT GROWTH Performed at [facility], [address] [place], [place] [address]    Report Status 07/20/2020 FINAL  Final  Blood culture (routine x 2)     Status: None (Preliminary result)   Collection Time: 07/19/20 11:30 AM   Specimen: BLOOD LEFT WRIST  Result Value Ref Range Status   Specimen Description BLOOD LEFT WRIST  Final   Special Requests   Final    BOTTLES DRAWN AEROBIC AND ANAEROBIC Blood Culture adequate volume   Culture   Final    NO GROWTH < 24 HOURS Performed at [facility], [address], [place], [place] [address]    Report Status PENDING  Incomplete    Studies/Results: DG Chest Portable 1 View  Result Date: 07/19/2020 CLINICAL DATA:  Sickle cell  crisis, BILATERAL arm and leg pain, fever, chest tightness with expiration EXAM: PORTABLE CHEST 1 VIEW COMPARISON:  Portable exam 0749 hours compared 03/19/2020 FINDINGS: Normal heart size, mediastinal contours, and pulmonary vascularity. Lungs clear. No acute infiltrate, pleural effusion, or pneumothorax. Dextroconvex thoracic scoliosis. IMPRESSION: No acute abnormalities. Electronically Signed   By: Ulyses Southward M.D.   On: 07/19/2020 08:16    Medications: Scheduled Meds: . DULoxetine  30 mg Oral Daily  . enoxaparin (LOVENOX) injection  40 mg Subcutaneous Q24H  . HYDROmorphone   Intravenous Q4H  . ketorolac  15 mg Intravenous Q6H  . senna-docusate  1 tablet Oral BID   Continuous Infusions: . sodium chloride    . cefTRIAXone (ROCEPHIN)  IV     PRN Meds:.acetaminophen, albuterol, diphenhydrAMINE, hydrOXYzine, naloxone **AND** sodium chloride  flush, ondansetron (ZOFRAN) IV, oxyCODONE, polyethylene glycol  Consultants:  None  Procedures:  None  Antibiotics:  IV Ceftriaxone  Assessment/Plan: Principal Problem:   Sickle cell pain crisis (HCC) Active Problems:   Febrile illness   Transaminitis   Hypokalemia   History of depression  Sickle cell disease with pain crisis:  Continue IV fluids, 0.45% saline at 100 mL/h Continue IV Dilaudid PCA per full dose Toradol 15 mg IV every 6 hours for total of 5 days Monitor vital signs very closely, reevaluate pain scale regularly, and supplemental oxygen as needed.  Fever of unknown origin: Patient continues to be periodically febrile. Initiate IV antibiotics. Blood cultures negative so far. Chest x-ray pending. Urinalysis unremarkable. Tylenol 650 mg every 4 hours as needed for fever  Hypokalemia: Potassium 2.9. Replete. Follow labs in AM.  History of depression: Continue home medications. Patient denies any suicidal homicidal intent. Transaminitis: Considered to be secondary to vaso-occlusive pain crisis. Follow closely. Hepatic function panel in a.m.  Sickle cell anemia: Hemoglobin is 10.5, consistent with patient's baseline. There is no clinical indication for blood transfusion at this time.  Code Status: Full Code Family Communication: N/A Disposition Plan: Not yet ready for discharge  Sarahelizabeth Conway Rennis Petty  APRN, MSN, FNP-C [facility] [address] [place], [place] [address] [phone]  If 7PM-7AM, please contact night-coverage.  07/20/2020, 1:12 PM  LOS: 1 day

## 2020-07-21 LAB — CBC
HCT: 25.4 % — ABNORMAL LOW (ref 36.0–46.0)
Hemoglobin: 9.5 g/dL — ABNORMAL LOW (ref 12.0–15.0)
MCH: 28.6 pg (ref 26.0–34.0)
MCHC: 37.4 g/dL — ABNORMAL HIGH (ref 30.0–36.0)
MCV: 76.5 fL — ABNORMAL LOW (ref 80.0–100.0)
Platelets: 246 10*3/uL (ref 150–400)
RBC: 3.32 MIL/uL — ABNORMAL LOW (ref 3.87–5.11)
RDW: 14.7 % (ref 11.5–15.5)
WBC: 11.8 10*3/uL — ABNORMAL HIGH (ref 4.0–10.5)
nRBC: 0.7 % — ABNORMAL HIGH (ref 0.0–0.2)

## 2020-07-21 MED ORDER — SODIUM CHLORIDE 0.9 % IV SOLN
2.0000 g | Freq: Three times a day (TID) | INTRAVENOUS | Status: DC
  Administered 2020-07-21 – 2020-07-25 (×12): 2 g via INTRAVENOUS
  Filled 2020-07-21 (×10): qty 2
  Filled 2020-07-21: qty 1.67
  Filled 2020-07-21 (×2): qty 2

## 2020-07-21 MED ORDER — POTASSIUM CHLORIDE CRYS ER 20 MEQ PO TBCR
20.0000 meq | EXTENDED_RELEASE_TABLET | Freq: Every day | ORAL | Status: DC
  Administered 2020-07-21 – 2020-07-26 (×6): 20 meq via ORAL
  Filled 2020-07-21 (×6): qty 1

## 2020-07-21 MED ORDER — AZITHROMYCIN 250 MG PO TABS
500.0000 mg | ORAL_TABLET | Freq: Every day | ORAL | Status: DC
  Administered 2020-07-21 – 2020-07-25 (×5): 500 mg via ORAL
  Filled 2020-07-21 (×5): qty 2

## 2020-07-21 NOTE — Progress Notes (Signed)
   07/21/20 2313  Assess: MEWS Score  Temp (!) 101.4 F (38.6 C)  BP 115/70  Pulse Rate (!) 108  Resp 18  SpO2 100 %  O2 Device Nasal Cannula  O2 Flow Rate (L/min) 2 L/min  Assess: MEWS Score  MEWS Temp 1  MEWS Systolic 0  MEWS Pulse 1  MEWS RR 0  MEWS LOC 0  MEWS Score 2  MEWS Score Color Yellow  Assess: if the MEWS score is Yellow or Red  Were vital signs taken at a resting state? Yes  Focused Assessment Change from prior assessment (see assessment flowsheet)  Early Detection of Sepsis Score *See Row Information* Low  MEWS guidelines implemented *See Row Information* Yes  Treat  MEWS Interventions Escalated (See documentation below)  Take Vital Signs  Increase Vital Sign Frequency  Yellow: Q 2hr X 2 then Q 4hr X 2, if remains yellow, continue Q 4hrs  Escalate  MEWS: Escalate Yellow: discuss with charge nurse/RN and consider discussing with provider and RRT  Notify: Charge Nurse/RN  Name of Charge Nurse/RN Notified Meredith, RN  Date Charge Nurse/RN Notified 07/21/20  Time Charge Nurse/RN Notified 2335

## 2020-07-21 NOTE — Progress Notes (Signed)
Subjective: Shelby Coffey is a 19 year old female with a medical history significant for sickle cell disease that was admitted for sickle cell pain crisis. Patient has been periodically febrile overnight.  Chest x-ray showed subtle heterogeneous airspace opacities at the bilateral lung base, concerning for infection. She continues to complain of generalized pain primarily to joints.  She denies any headache, chest pain, urinary symptoms, shortness of breath, nausea, vomiting, or diarrhea.  Objective:  Vital signs in last 24 hours:  Vitals:   07/21/20 0405 07/21/20 0527 07/21/20 1308 07/21/20 1502  BP:  115/60 106/66 113/63  Pulse:  100 (!) 108 (!) 112  Resp: 18 16 17    Temp:  100 F (37.8 C) (!) 101 F (38.3 C) 98.6 F (37 C)  TempSrc:  Oral Oral Oral  SpO2: 97% 92% 99% 95%  Weight:  71.7 kg    Height:        Intake/Output from previous day:   Intake/Output Summary (Last 24 hours) at 07/21/2020 1804 Last data filed at 07/21/2020 0300 Gross per 24 hour  Intake 30 ml  Output --  Net 30 ml    Physical Exam: General: Alert, awake, oriented x3, in no acute distress.  HEENT: [place]/AT PEERL, EOMI Neck: Trachea midline,  no masses, no thyromegal,y no JVD, no carotid bruit OROPHARYNX:  Moist, No exudate/ erythema/lesions.  Heart: Regular rate and rhythm, without murmurs, rubs, gallops, PMI non-displaced, no heaves or thrills on palpation.  Lungs: Clear to auscultation, no wheezing or rhonchi noted. No increased vocal fremitus resonant to percussion  Abdomen: Soft, nontender, nondistended, positive bowel sounds, no masses no hepatosplenomegaly noted..  Neuro: No focal neurological deficits noted cranial nerves II through XII grossly intact. DTRs 2+ bilaterally upper and lower extremities. Strength 5 out of 5 in bilateral upper and lower extremities. Musculoskeletal: No warm swelling or erythema around joints, no spinal tenderness noted. Psychiatric: Patient alert and oriented x3, good  insight and cognition, good recent to remote recall. Lymph node survey: No cervical axillary or inguinal lymphadenopathy noted.  Lab Results:  Basic Metabolic Panel:    Component Value Date/Time   NA 137 07/19/2020 2133   K 3.2 (L) 07/19/2020 2133   CL 107 07/19/2020 2133   CO2 22 07/19/2020 2133   BUN <5 (L) 07/19/2020 2133   CREATININE 0.51 07/19/2020 2133   GLUCOSE 102 (H) 07/19/2020 2133   CALCIUM 8.0 (L) 07/19/2020 2133   CBC:    Component Value Date/Time   WBC 11.8 (H) 07/21/2020 0549   HGB 9.5 (L) 07/21/2020 0549   HCT 25.4 (L) 07/21/2020 0549   PLT 246 07/21/2020 0549   MCV 76.5 (L) 07/21/2020 0549   NEUTROABS 5.6 07/19/2020 0804   LYMPHSABS 1.7 07/19/2020 0804   MONOABS 0.6 07/19/2020 0804   EOSABS 0.3 07/19/2020 0804   BASOSABS 0.1 07/19/2020 0804    Recent Results (from the past 240 hour(s))  Blood culture (routine x 2)     Status: None (Preliminary result)   Collection Time: 07/19/20  8:05 AM   Specimen: BLOOD RIGHT ARM  Result Value Ref Range Status   Specimen Description BLOOD RIGHT ARM  Final   Special Requests   Final    BOTTLES DRAWN AEROBIC AND ANAEROBIC Blood Culture adequate volume   Culture   Final    NO GROWTH 2 DAYS Performed at [facility], [address], [place], [place]    Report Status PENDING  Incomplete  Resp Panel by RT-PCR (Flu A&B, Covid)  Nasopharyngeal Swab     Status: None   Collection Time: 07/19/20  8:11 AM   Specimen: Nasopharyngeal Swab; Nasopharyngeal(NP) swabs in vial transport medium  Result Value Ref Range Status   SARS Coronavirus 2 by RT PCR NEGATIVE NEGATIVE Final    Comment: (NOTE) SARS-CoV-2 target nucleic acids are NOT DETECTED.  The SARS-CoV-2 RNA is generally detectable in upper respiratory specimens during the acute phase of infection. The lowest concentration of SARS-CoV-2 viral copies this assay can detect is 138 copies/mL. A negative result does not preclude SARS-Cov-2 infection and should  not be used as the sole basis for treatment or other patient management decisions. A negative result may occur with  improper specimen collection/handling, submission of specimen other than nasopharyngeal swab, presence of viral mutation(s) within the areas targeted by this assay, and inadequate number of viral copies(<138 copies/mL). A negative result must be combined with clinical observations, patient history, and epidemiological information. The expected result is Negative.  Fact Sheet for Patients:  BloggerCourse.com  Fact Sheet for Healthcare Providers:  SeriousBroker.it  This test is no t yet approved or cleared by the [place] FDA and  has been authorized for detection and/or diagnosis of SARS-CoV-2 by FDA under an Emergency Use Authorization (EUA). This EUA will remain  in effect (meaning this test can be used) for the duration of the COVID-19 declaration under Section 564(b)(1) of the Act, 21 U.S.C.section 360bbb-3(b)(1), unless the authorization is terminated  or revoked sooner.       Influenza A by PCR NEGATIVE NEGATIVE Final   Influenza B by PCR NEGATIVE NEGATIVE Final    Comment: (NOTE) The Xpert Xpress SARS-CoV-2/FLU/RSV plus assay is intended as an aid in the diagnosis of influenza from Nasopharyngeal swab specimens and should not be used as a sole basis for treatment. Nasal washings and aspirates are unacceptable for Xpert Xpress SARS-CoV-2/FLU/RSV testing.  Fact Sheet for Patients: BloggerCourse.com  Fact Sheet for Healthcare Providers: SeriousBroker.it  This test is not yet approved or cleared by the [place] FDA and has been authorized for detection and/or diagnosis of SARS-CoV-2 by FDA under an Emergency Use Authorization (EUA). This EUA will remain in effect (meaning this test can be used) for the duration of the COVID-19 declaration under  Section 564(b)(1) of the Act, 21 U.S.C. section 360bbb-3(b)(1), unless the authorization is terminated or revoked.  Performed at [facility], [address], [place], [place] [address]   Urine culture     Status: Abnormal   Collection Time: 07/19/20 11:15 AM   Specimen: Urine, Random  Result Value Ref Range Status   Specimen Description URINE, RANDOM  Final   Special Requests NONE  Final   Culture (A)  Final    <10,000 COLONIES/mL INSIGNIFICANT GROWTH Performed at [facility], [address], [place], [place] [address]    Report Status 07/20/2020 FINAL  Final  Blood culture (routine x 2)     Status: None (Preliminary result)   Collection Time: 07/19/20 11:30 AM   Specimen: BLOOD LEFT WRIST  Result Value Ref Range Status   Specimen Description BLOOD LEFT WRIST  Final   Special Requests   Final    BOTTLES DRAWN AEROBIC AND ANAEROBIC Blood Culture adequate volume   Culture   Final    NO GROWTH 2 DAYS Performed at [facility], [address] [place], [place] [address]    Report Status PENDING  Incomplete    Studies/Results: DG Chest 2 View  Result Date: 07/20/2020 CLINICAL DATA:  Sickle cell crisis, chest pain, body aches EXAM: CHEST - 2 VIEW COMPARISON:  07/19/2020 FINDINGS: The heart size and mediastinal contours are within normal limits. Subtle heterogeneous airspace opacities at the bilateral lung bases. Dextroscoliosis of the thoracic spine IMPRESSION: Subtle heterogeneous airspace opacities at the bilateral lung bases, concerning for infection. Electronically Signed   By: Lauralyn Primes M.D.   On: 07/20/2020 13:52    Medications: Scheduled Meds: . DULoxetine  30 mg Oral Daily  . enoxaparin (LOVENOX) injection  40 mg Subcutaneous Q24H  . folic acid  1 mg Oral Daily  . HYDROmorphone   Intravenous Q4H  . ketorolac  15 mg Intravenous Q6H  . senna-docusate  1 tablet Oral BID   Continuous Infusions: . cefTRIAXone (ROCEPHIN)  IV 1 g (07/21/20 1352)    PRN Meds:.acetaminophen, albuterol, diphenhydrAMINE, hydrOXYzine, naloxone **AND** sodium chloride flush, ondansetron (ZOFRAN) IV, oxyCODONE, polyethylene glycol  Consultants:  None  Procedures:  None  Antibiotics:  IV Cefepime  Oral Azithromycin  Discontinue Ceftriaxone  Assessment/Plan: Principal Problem:   Sickle cell pain crisis (HCC) Active Problems:   Febrile illness   Transaminitis   Hypokalemia   History of depression  Acute chest syndrome versus CAP: Patient febrile.  Chest x-ray shows subtle heterogeneous airspace opacity at the bilateral lung bases, concerning for infection.  Oxygen requirement 2 L. Heart rate 100s-110s Broad-spectrum antibiotics initiated. Incentive spirometer.  Tylenol 650 mg every 4 hours as needed for fever.  Continue pain control.  Monitor vital signs very closely  Sickle cell disease with pain crisis: Continue IV fluids at [facility]. Continue IV Dilaudid PCA per full dose Toradol 15 mg IV every 6 hours for total of 5 days Oxycodone 5 mg every 4 hours as needed for severe breakthrough pain Monitor vital signs closely, reevaluate pain scale regularly, and supplemental oxygen as needed.  Sickle cell anemia: Hemoglobin 9.5, slightly below patient's baseline.  No clinical indication for blood transfusion at this time.  Continue to follow closely.  CBC in a.m.  History of depression: Continue home medications.  Patient denies any suicidal homicidal intentions.  Continue to follow closely.  Hypokalemia: Replete.  Follow BMP in a.m.  Code Status: Full Code Family Communication: N/A Disposition Plan: Not yet ready for discharge   If 7PM-7AM, please contact night-coverage.  07/21/2020, 6:04 PM  LOS: 2 days

## 2020-07-21 NOTE — Progress Notes (Signed)
Pharmacy Antibiotic Note  Shelby Coffey is a 19 y.o. female admitted on 07/19/2020 with sickle cell pain crisis. Pharmacy has been consulted for today for Cefepime dosing for acute chest syndrome vs PNA.  Plan: Cefepime 2g IV q8h Need for dosage adjustment appears unlikely at present, so pharmacy will sign off at this time.  Please reconsult if a change in clinical status warrants re-evaluation of dosage.     Height: 5\' 7"  (170.2 cm) Weight: 71.7 kg (158 lb 1.1 oz) IBW/kg (Calculated) : 61.6  Temp (24hrs), Avg:99.5 F (37.5 C), Min:98.6 F (37 C), Max:101 F (38.3 C)  Recent Labs  Lab 07/19/20 0804 07/19/20 0815 07/19/20 0917 07/19/20 2133 07/20/20 0601 07/21/20 0549  WBC 8.3  --   --   --  10.3 11.8*  CREATININE 0.78  --   --  0.51  --   --   LATICACIDVEN  --  3.1* 0.9  --   --   --     Estimated Creatinine Clearance: 109.1 mL/min (by C-G formula based on SCr of 0.51 mg/dL).    No Known Allergies    Thank you for allowing pharmacy to be a part of this patient's care.  07/23/20 07/21/2020 6:17 PM

## 2020-07-21 NOTE — Progress Notes (Signed)
°   07/21/20 1340  Assess: MEWS Score  Level of Consciousness Alert  Assess: if the MEWS score is Yellow or Red  Were vital signs taken at a resting state? Yes  Focused Assessment No change from prior assessment  Early Detection of Sepsis Score *See Row Information* Low  MEWS guidelines implemented *See Row Information* Yes  Take Vital Signs  Increase Vital Sign Frequency  Yellow: Q 2hr X 2 then Q 4hr X 2, if remains yellow, continue Q 4hrs  Escalate  MEWS: Escalate Yellow: discuss with charge nurse/RN and consider discussing with provider and RRT  Notify: Charge Nurse/RN  Name of Charge Nurse/RN Notified Cindy RN  Date Charge Nurse/RN Notified 07/21/20  Time Charge Nurse/RN Notified 1340  Notify: Provider  Provider Name/Title [place] NP  Date Provider Notified 07/21/20  Time Provider Notified 1345  Notification Type Call  Notification Reason Change in status  Response No new orders  Document  Patient Outcome Stabilized after interventions (prn tylenol given)  Progress note created (see row info) Yes  Vitals: temp:101, bp, 106/66, p-108, resp-17, o2-99 via 2L.

## 2020-07-22 ENCOUNTER — Inpatient Hospital Stay (HOSPITAL_COMMUNITY): Payer: Medicaid Other

## 2020-07-22 DIAGNOSIS — D5701 Hb-SS disease with acute chest syndrome: Secondary | ICD-10-CM

## 2020-07-22 LAB — CBC WITH DIFFERENTIAL/PLATELET
Abs Immature Granulocytes: 0.1 10*3/uL — ABNORMAL HIGH (ref 0.00–0.07)
Basophils Absolute: 0.1 10*3/uL (ref 0.0–0.1)
Basophils Relative: 1 %
Eosinophils Absolute: 0.4 10*3/uL (ref 0.0–0.5)
Eosinophils Relative: 3 %
HCT: 25.7 % — ABNORMAL LOW (ref 36.0–46.0)
Hemoglobin: 9.5 g/dL — ABNORMAL LOW (ref 12.0–15.0)
Immature Granulocytes: 1 %
Lymphocytes Relative: 28 %
Lymphs Abs: 3.9 10*3/uL (ref 0.7–4.0)
MCH: 28.6 pg (ref 26.0–34.0)
MCHC: 37 g/dL — ABNORMAL HIGH (ref 30.0–36.0)
MCV: 77.4 fL — ABNORMAL LOW (ref 80.0–100.0)
Monocytes Absolute: 1.5 10*3/uL — ABNORMAL HIGH (ref 0.1–1.0)
Monocytes Relative: 10 %
Neutro Abs: 8.2 10*3/uL — ABNORMAL HIGH (ref 1.7–7.7)
Neutrophils Relative %: 57 %
Platelets: 253 10*3/uL (ref 150–400)
RBC: 3.32 MIL/uL — ABNORMAL LOW (ref 3.87–5.11)
RDW: 15.1 % (ref 11.5–15.5)
WBC: 14.2 10*3/uL — ABNORMAL HIGH (ref 4.0–10.5)
nRBC: 0.9 % — ABNORMAL HIGH (ref 0.0–0.2)

## 2020-07-22 LAB — COMPREHENSIVE METABOLIC PANEL
ALT: 100 U/L — ABNORMAL HIGH (ref 0–44)
AST: 136 U/L — ABNORMAL HIGH (ref 15–41)
Albumin: 2.9 g/dL — ABNORMAL LOW (ref 3.5–5.0)
Alkaline Phosphatase: 50 U/L (ref 38–126)
Anion gap: 8 (ref 5–15)
BUN: <5 mg/dL — ABNORMAL LOW (ref 6–20)
CO2: 24 mmol/L (ref 22–32)
Calcium: 8.2 mg/dL — ABNORMAL LOW (ref 8.9–10.3)
Chloride: 104 mmol/L (ref 98–111)
Creatinine, Ser: 0.56 mg/dL (ref 0.44–1.00)
GFR, Estimated: >60 mL/min (ref >60)
Glucose, Bld: 97 mg/dL (ref 70–99)
Potassium: 3.6 mmol/L (ref 3.5–5.1)
Sodium: 136 mmol/L (ref 135–145)
Total Bilirubin: 1.1 mg/dL (ref 0.3–1.2)
Total Protein: 6 g/dL — ABNORMAL LOW (ref 6.5–8.1)

## 2020-07-22 MED ORDER — LACTULOSE 10 GM/15ML PO SOLN
30.0000 g | Freq: Two times a day (BID) | ORAL | Status: AC
  Administered 2020-07-22 (×2): 30 g via ORAL
  Filled 2020-07-22 (×4): qty 45

## 2020-07-22 MED ORDER — IOHEXOL 350 MG/ML SOLN
100.0000 mL | Freq: Once | INTRAVENOUS | Status: AC | PRN
  Administered 2020-07-22: 20:00:00 100 mL via INTRAVENOUS

## 2020-07-22 NOTE — Progress Notes (Signed)
Shelby Coffey was experiencing stabbing chest pain, she states it has been happening with activity. O2 via Shelby Coffey @ 2L in place. Dr Hyman Hopes ordered EKG and give her Toradol. EKG showed Sinus Tachy. After the Toradol was given the pain decreased.

## 2020-07-22 NOTE — Progress Notes (Signed)
Patient ID: Shelby Coffey, female   DOB: 07-02-2001, 19 y.o.   MRN: [id number] Subjective: Shelby Coffey is a 19 year old female with a medical history significant for sickle cell disease that was admitted for sickle cell pain crisis.  Patient has been having increased heart rate, she said her heart rate went over 200 at some point this morning without much activity, associated with mild dizziness before heart rate returned to baseline. She also reported mild chest pain and shortness of breath associated with palpitation. She is still having pain in her upper extremities and lower back.  She also has on and off fever, currently on antibiotics.  She denies any headache, nausea, vomiting or diarrhea. No urinary symptoms. Patient has not had bowel movement since admission.  Objective:  Vital signs in last 24 hours:  Vitals:   07/22/20 0114 07/22/20 0514 07/22/20 0541 07/22/20 0911  BP: (!) 109/52 107/68  126/80  Pulse: 96 92  99  Resp: 18 18 15 16   Temp: 99.5 F (37.5 C) 98.3 F (36.8 C)  99 F (37.2 C)  TempSrc: Oral Oral  Oral  SpO2: 99% 100% 98% (!) 80%  Weight:  68.8 kg    Height:        Intake/Output from previous day:   Intake/Output Summary (Last 24 hours) at 07/22/2020 1425 Last data filed at 07/22/2020 0935 Gross per 24 hour  Intake 340 ml  Output 400 ml  Net -60 ml   Physical Exam: General: Alert, awake, oriented x3, in no acute distress.  HEENT: [place]/AT PEERL, EOMI Neck: Trachea midline,  no masses, no thyromegal,y no JVD, no carotid bruit OROPHARYNX:  Moist, No exudate/ erythema/lesions.  Heart: Regular rate and rhythm, without murmurs, rubs, gallops, PMI non-displaced, no heaves or thrills on palpation.  Lungs: Clear to auscultation, no wheezing or rhonchi noted. No increased vocal fremitus resonant to percussion  Abdomen: Soft, nontender, nondistended, positive bowel sounds, no masses no hepatosplenomegaly noted..  Neuro: No focal neurological deficits noted cranial  nerves II through XII grossly intact. DTRs 2+ bilaterally upper and lower extremities. Strength 5 out of 5 in bilateral upper and lower extremities. Musculoskeletal: No warm swelling or erythema around joints, no spinal tenderness noted. Psychiatric: Patient alert and oriented x3, good insight and cognition, good recent to remote recall. Lymph node survey: No cervical axillary or inguinal lymphadenopathy noted.  Lab Results:  Basic Metabolic Panel:    Component Value Date/Time   NA 136 07/22/2020 0513   K 3.6 07/22/2020 0513   CL 104 07/22/2020 0513   CO2 24 07/22/2020 0513   BUN <5 (L) 07/22/2020 0513   CREATININE 0.56 07/22/2020 0513   GLUCOSE 97 07/22/2020 0513   CALCIUM 8.2 (L) 07/22/2020 0513   CBC:    Component Value Date/Time   WBC 14.2 (H) 07/22/2020 0513   HGB 9.5 (L) 07/22/2020 0513   HCT 25.7 (L) 07/22/2020 0513   PLT 253 07/22/2020 0513   MCV 77.4 (L) 07/22/2020 0513   NEUTROABS 8.2 (H) 07/22/2020 0513   LYMPHSABS 3.9 07/22/2020 0513   MONOABS 1.5 (H) 07/22/2020 0513   EOSABS 0.4 07/22/2020 0513   BASOSABS 0.1 07/22/2020 0513    Recent Results (from the past 240 hour(s))  Blood culture (routine x 2)     Status: None (Preliminary result)   Collection Time: 07/19/20  8:05 AM   Specimen: BLOOD RIGHT ARM  Result Value Ref Range Status   Specimen Description BLOOD RIGHT ARM  Final   Special Requests  Final    BOTTLES DRAWN AEROBIC AND ANAEROBIC Blood Culture adequate volume   Culture   Final    NO GROWTH 3 DAYS Performed at [facility], [address], [place], [place] [address]    Report Status PENDING  Incomplete  Resp Panel by RT-PCR (Flu A&B, Covid) Nasopharyngeal Swab     Status: None   Collection Time: 07/19/20  8:11 AM   Specimen: Nasopharyngeal Swab; Nasopharyngeal(NP) swabs in vial transport medium  Result Value Ref Range Status   SARS Coronavirus 2 by RT PCR NEGATIVE NEGATIVE Final    Comment: (NOTE) SARS-CoV-2 target nucleic acids are NOT  DETECTED.  The SARS-CoV-2 RNA is generally detectable in upper respiratory specimens during the acute phase of infection. The lowest concentration of SARS-CoV-2 viral copies this assay can detect is 138 copies/mL. A negative result does not preclude SARS-Cov-2 infection and should not be used as the sole basis for treatment or other patient management decisions. A negative result may occur with  improper specimen collection/handling, submission of specimen other than nasopharyngeal swab, presence of viral mutation(s) within the areas targeted by this assay, and inadequate number of viral copies(<138 copies/mL). A negative result must be combined with clinical observations, patient history, and epidemiological information. The expected result is Negative.  Fact Sheet for Patients:  BloggerCourse.com  Fact Sheet for Healthcare Providers:  SeriousBroker.it  This test is no t yet approved or cleared by the [place] FDA and  has been authorized for detection and/or diagnosis of SARS-CoV-2 by FDA under an Emergency Use Authorization (EUA). This EUA will remain  in effect (meaning this test can be used) for the duration of the COVID-19 declaration under Section 564(b)(1) of the Act, 21 U.S.C.section 360bbb-3(b)(1), unless the authorization is terminated  or revoked sooner.       Influenza A by PCR NEGATIVE NEGATIVE Final   Influenza B by PCR NEGATIVE NEGATIVE Final    Comment: (NOTE) The Xpert Xpress SARS-CoV-2/FLU/RSV plus assay is intended as an aid in the diagnosis of influenza from Nasopharyngeal swab specimens and should not be used as a sole basis for treatment. Nasal washings and aspirates are unacceptable for Xpert Xpress SARS-CoV-2/FLU/RSV testing.  Fact Sheet for Patients: BloggerCourse.com  Fact Sheet for Healthcare Providers: SeriousBroker.it  This test is not yet  approved or cleared by the [place] FDA and has been authorized for detection and/or diagnosis of SARS-CoV-2 by FDA under an Emergency Use Authorization (EUA). This EUA will remain in effect (meaning this test can be used) for the duration of the COVID-19 declaration under Section 564(b)(1) of the Act, 21 U.S.C. section 360bbb-3(b)(1), unless the authorization is terminated or revoked.  Performed at [facility], [address], [place], [place] [address]   Urine culture     Status: Abnormal   Collection Time: 07/19/20 11:15 AM   Specimen: Urine, Random  Result Value Ref Range Status   Specimen Description URINE, RANDOM  Final   Special Requests NONE  Final   Culture (A)  Final    <10,000 COLONIES/mL INSIGNIFICANT GROWTH Performed at [facility], [address] [place], [place] [address]    Report Status 07/20/2020 FINAL  Final  Blood culture (routine x 2)     Status: None (Preliminary result)   Collection Time: 07/19/20 11:30 AM   Specimen: BLOOD LEFT WRIST  Result Value Ref Range Status   Specimen Description BLOOD LEFT WRIST  Final   Special Requests   Final  BOTTLES DRAWN AEROBIC AND ANAEROBIC Blood Culture adequate volume   Culture   Final    NO GROWTH 3 DAYS Performed at [facility], [address], [place], [place] [address]    Report Status PENDING  Incomplete    Studies/Results: No results found.  Medications: Scheduled Meds: . azithromycin  500 mg Oral Daily  . DULoxetine  30 mg Oral Daily  . enoxaparin (LOVENOX) injection  40 mg Subcutaneous Q24H  . folic acid  1 mg Oral Daily  . HYDROmorphone   Intravenous Q4H  . ketorolac  15 mg Intravenous Q6H  . lactulose  30 g Oral BID  . potassium chloride SA  20 mEq Oral Daily  . senna-docusate  1 tablet Oral BID   Continuous Infusions: . ceFEPime (MAXIPIME) IV 2 g (07/22/20 1320)   PRN Meds:.acetaminophen, albuterol, diphenhydrAMINE, hydrOXYzine, naloxone **AND** sodium chloride  flush, ondansetron (ZOFRAN) IV, oxyCODONE, polyethylene glycol  Consultants:  None  Procedures:  Bedside 12-lead EKG showed sinus tachycardia  Antibiotics:  IV cefepime  Oral azithromycin  Assessment/Plan: Principal Problem:   Sickle cell pain crisis (HCC) Active Problems:   Febrile illness   Acute chest syndrome due to sickle-cell disease (HCC)   Transaminitis   Hypokalemia   History of depression  1. Acute Chest Syndrome versus CAP: With on and off fever and tachycardia plus shortness of breath, however obtain a CT angiogram to rule out PE.  We will continue antibiotics, incentive spirometry and supplemental oxygen.  Tylenol as needed for fever.  Monitor very closely with telemetry. 2. Hb Sickle Cell Disease with crisis: Continue IVF at [facility], continue weight based Dilaudid PCA at current dosage, IV Toradol 15 mg Q 6 H for total of 5 days, continue oral oxycodone 5 mg tablet every 4 hourly as needed pain. Monitor vitals very closely, Re-evaluate pain scale regularly, 2 L of Oxygen by [place].  Encourage patient to ambulate 3. Leukocytosis: Mild, WBC count up to 14.  We will continue antibiotics and monitor very closely.  If fever persist despite antibiotics, will obtain another set of blood culture. 4. Sickle Cell Anemia: Hemoglobin is stable.  No clinical indication for blood transfusion today.  We will continue to monitor very closely, transfuse as needed.  Repeat labs in a.m. 5. Chronic pain Syndrome: Continue home pain medications. 6. Hypokalemia: Resolved.  Repeat lab in a.m. and replete as needed. 7. History of Major Depression: Patient denies any suicidal ideations or thoughts. We will continue home medications.  Code Status: Full Code Family Communication: N/A Disposition Plan: Not yet ready for discharge  Surina Storts  If 7PM-7AM, please contact night-coverage.  07/22/2020, 2:25 PM  LOS: 3 days

## 2020-07-23 LAB — CBC WITH DIFFERENTIAL/PLATELET
Abs Immature Granulocytes: 0.14 10*3/uL — ABNORMAL HIGH (ref 0.00–0.07)
Basophils Absolute: 0.1 10*3/uL (ref 0.0–0.1)
Basophils Relative: 0 %
Eosinophils Absolute: 0.5 10*3/uL (ref 0.0–0.5)
Eosinophils Relative: 3 %
HCT: 25.7 % — ABNORMAL LOW (ref 36.0–46.0)
Hemoglobin: 9.5 g/dL — ABNORMAL LOW (ref 12.0–15.0)
Immature Granulocytes: 1 %
Lymphocytes Relative: 22 %
Lymphs Abs: 3.1 10*3/uL (ref 0.7–4.0)
MCH: 28.8 pg (ref 26.0–34.0)
MCHC: 37 g/dL — ABNORMAL HIGH (ref 30.0–36.0)
MCV: 77.9 fL — ABNORMAL LOW (ref 80.0–100.0)
Monocytes Absolute: 1.9 10*3/uL — ABNORMAL HIGH (ref 0.1–1.0)
Monocytes Relative: 14 %
Neutro Abs: 8.2 10*3/uL — ABNORMAL HIGH (ref 1.7–7.7)
Neutrophils Relative %: 60 %
Platelets: 283 10*3/uL (ref 150–400)
RBC: 3.3 MIL/uL — ABNORMAL LOW (ref 3.87–5.11)
RDW: 15 % (ref 11.5–15.5)
WBC: 13.7 10*3/uL — ABNORMAL HIGH (ref 4.0–10.5)
nRBC: 0.7 % — ABNORMAL HIGH (ref 0.0–0.2)

## 2020-07-23 LAB — COMPREHENSIVE METABOLIC PANEL
ALT: 85 U/L — ABNORMAL HIGH (ref 0–44)
AST: 89 U/L — ABNORMAL HIGH (ref 15–41)
Albumin: 2.9 g/dL — ABNORMAL LOW (ref 3.5–5.0)
Alkaline Phosphatase: 54 U/L (ref 38–126)
Anion gap: 9 (ref 5–15)
BUN: <5 mg/dL — ABNORMAL LOW (ref 6–20)
CO2: 22 mmol/L (ref 22–32)
Calcium: 8.3 mg/dL — ABNORMAL LOW (ref 8.9–10.3)
Chloride: 104 mmol/L (ref 98–111)
Creatinine, Ser: 0.51 mg/dL (ref 0.44–1.00)
GFR, Estimated: >60 mL/min (ref >60)
Glucose, Bld: 104 mg/dL — ABNORMAL HIGH (ref 70–99)
Potassium: 4.1 mmol/L (ref 3.5–5.1)
Sodium: 135 mmol/L (ref 135–145)
Total Bilirubin: 1.3 mg/dL — ABNORMAL HIGH (ref 0.3–1.2)
Total Protein: 6.1 g/dL — ABNORMAL LOW (ref 6.5–8.1)

## 2020-07-23 NOTE — Progress Notes (Signed)
Subjective: Shelby Coffey is a 19 year old female with a medical history significant for sickle cell disease that was admitted for sickle cell pain crisis.  Patient states that she feels much better on today. Pain intensity has decreased to 6/10. She denies any headache, shortness of breath, urinary symptoms, nausea, vomiting, or diarrhea. Patient has been afebrile overnight.   Objective:  Vital signs in last 24 hours:  Vitals:   07/23/20 1022 07/23/20 1124 07/23/20 1416 07/23/20 1518  BP:   99/65   Pulse:   99   Resp:  15 16 17   Temp:   98.1 F (36.7 C)   TempSrc:   Oral   SpO2: 100% 98% 99% 97%  Weight:      Height:        Intake/Output from previous day:  No intake or output data in the 24 hours ending 07/23/20 1722  Physical Exam: General: Alert, awake, oriented x3, in no acute distress.  HEENT: [place]/AT PEERL, EOMI Neck: Trachea midline,  no masses, no thyromegal,y no JVD, no carotid bruit OROPHARYNX:  Moist, No exudate/ erythema/lesions.  Heart: Regular rate and rhythm, without murmurs, rubs, gallops, PMI non-displaced, no heaves or thrills on palpation.  Lungs: Clear to auscultation, no wheezing or rhonchi noted. No increased vocal fremitus resonant to percussion  Abdomen: Soft, nontender, nondistended, positive bowel sounds, no masses no hepatosplenomegaly noted..  Neuro: No focal neurological deficits noted cranial nerves II through XII grossly intact. DTRs 2+ bilaterally upper and lower extremities. Strength 5 out of 5 in bilateral upper and lower extremities. Musculoskeletal: No warm swelling or erythema around joints, no spinal tenderness noted. Psychiatric: Patient alert and oriented x3, good insight and cognition, good recent to remote recall. Lymph node survey: No cervical axillary or inguinal lymphadenopathy noted.  Lab Results:  Basic Metabolic Panel:    Component Value Date/Time   NA 135 07/23/2020 0613   K 4.1 07/23/2020 0613   CL 104 07/23/2020 0613    CO2 22 07/23/2020 0613   BUN <5 (L) 07/23/2020 0613   CREATININE 0.51 07/23/2020 0613   GLUCOSE 104 (H) 07/23/2020 0613   CALCIUM 8.3 (L) 07/23/2020 0613   CBC:    Component Value Date/Time   WBC 13.7 (H) 07/23/2020 0613   HGB 9.5 (L) 07/23/2020 0613   HCT 25.7 (L) 07/23/2020 0613   PLT 283 07/23/2020 0613   MCV 77.9 (L) 07/23/2020 0613   NEUTROABS 8.2 (H) 07/23/2020 0613   LYMPHSABS 3.1 07/23/2020 0613   MONOABS 1.9 (H) 07/23/2020 0613   EOSABS 0.5 07/23/2020 0613   BASOSABS 0.1 07/23/2020 16100613    Recent Results (from the past 240 hour(s))  Blood culture (routine x 2)     Status: None (Preliminary result)   Collection Time: 07/19/20  8:05 AM   Specimen: BLOOD RIGHT ARM  Result Value Ref Range Status   Specimen Description BLOOD RIGHT ARM  Final   Special Requests   Final    BOTTLES DRAWN AEROBIC AND ANAEROBIC Blood Culture adequate volume   Culture   Final    NO GROWTH 4 DAYS Performed at [facility], [address] [place], [place] [id number]    Report Status PENDING  Incomplete  Resp Panel by RT-PCR (Flu A&B, Covid) Nasopharyngeal Swab     Status: None   Collection Time: 07/19/20  8:11 AM   Specimen: Nasopharyngeal Swab; Nasopharyngeal(NP) swabs in vial transport medium  Result Value Ref Range Status   SARS Coronavirus 2 by RT  PCR NEGATIVE NEGATIVE Final    Comment: (NOTE) SARS-CoV-2 target nucleic acids are NOT DETECTED.  The SARS-CoV-2 RNA is generally detectable in upper respiratory specimens during the acute phase of infection. The lowest concentration of SARS-CoV-2 viral copies this assay can detect is 138 copies/mL. A negative result does not preclude SARS-Cov-2 infection and should not be used as the sole basis for treatment or other patient management decisions. A negative result may occur with  improper specimen collection/handling, submission of specimen other than nasopharyngeal swab, presence of viral mutation(s) within the areas targeted by  this assay, and inadequate number of viral copies(<138 copies/mL). A negative result must be combined with clinical observations, patient history, and epidemiological information. The expected result is Negative.  Fact Sheet for Patients:  BloggerCourse.com  Fact Sheet for Healthcare Providers:  SeriousBroker.it  This test is no t yet approved or cleared by the [place] FDA and  has been authorized for detection and/or diagnosis of SARS-CoV-2 by FDA under an Emergency Use Authorization (EUA). This EUA will remain  in effect (meaning this test can be used) for the duration of the COVID-19 declaration under Section 564(b)(1) of the Act, 21 U.S.C.section 360bbb-3(b)(1), unless the authorization is terminated  or revoked sooner.       Influenza A by PCR NEGATIVE NEGATIVE Final   Influenza B by PCR NEGATIVE NEGATIVE Final    Comment: (NOTE) The Xpert Xpress SARS-CoV-2/FLU/RSV plus assay is intended as an aid in the diagnosis of influenza from Nasopharyngeal swab specimens and should not be used as a sole basis for treatment. Nasal washings and aspirates are unacceptable for Xpert Xpress SARS-CoV-2/FLU/RSV testing.  Fact Sheet for Patients: BloggerCourse.com  Fact Sheet for Healthcare Providers: SeriousBroker.it  This test is not yet approved or cleared by the [place] FDA and has been authorized for detection and/or diagnosis of SARS-CoV-2 by FDA under an Emergency Use Authorization (EUA). This EUA will remain in effect (meaning this test can be used) for the duration of the COVID-19 declaration under Section 564(b)(1) of the Act, 21 U.S.C. section 360bbb-3(b)(1), unless the authorization is terminated or revoked.  Performed at [facility], [address], [place], [place] [address]   Urine culture     Status: Abnormal   Collection Time: 07/19/20 11:15 AM    Specimen: Urine, Random  Result Value Ref Range Status   Specimen Description URINE, RANDOM  Final   Special Requests NONE  Final   Culture (A)  Final    <10,000 COLONIES/mL INSIGNIFICANT GROWTH Performed at [facility], [address] [place], [place] [address]    Report Status 07/20/2020 FINAL  Final  Blood culture (routine x 2)     Status: None (Preliminary result)   Collection Time: 07/19/20 11:30 AM   Specimen: BLOOD LEFT WRIST  Result Value Ref Range Status   Specimen Description BLOOD LEFT WRIST  Final   Special Requests   Final    BOTTLES DRAWN AEROBIC AND ANAEROBIC Blood Culture adequate volume   Culture   Final    NO GROWTH 4 DAYS Performed at [facility], [address], [place], [place] [address]    Report Status PENDING  Incomplete    Studies/Results: CT ANGIO CHEST PE W OR WO CONTRAST  Result Date: 07/22/2020 CLINICAL DATA:  19 year old female with concern for pulmonary embolism. EXAM: CT ANGIOGRAPHY CHEST WITH CONTRAST TECHNIQUE: Multidetector CT imaging of the chest was performed using the standard protocol during bolus administration of  intravenous contrast. Multiplanar CT image reconstructions and MIPs were obtained to evaluate the vascular anatomy. CONTRAST:  OMNIPAQUE IOHEXOL 350 MG/ML SOLN COMPARISON:  Chest radiograph dated 07/20/2020. FINDINGS: Cardiovascular: There is no cardiomegaly or pericardial effusion. The thoracic aorta is unremarkable. Evaluation of the pulmonary arteries is somewhat limited due to suboptimal opacification. No pulmonary artery embolus identified. Mediastinum/Nodes: Mild bilateral hilar and subcarinal adenopathy. The esophagus and the thyroid gland are grossly unremarkable. No mediastinal fluid collection. Lungs/Pleura: Bilateral lower lobe predominant patchy ground-glass opacities concerning for multifocal pneumonia. Clinical correlation and follow-up to resolution recommended. There are small bilateral pleural  effusions. No pneumothorax. The central airways are patent. Upper Abdomen: Indeterminate 4.1 x 4.1 cm rounded lesion in the spleen. Further evaluation with ultrasound on a nonemergent/outpatient basis recommended. Musculoskeletal: No acute osseous pathology. Scoliosis. Review of the MIP images confirms the above findings. IMPRESSION: 1. No CT evidence of pulmonary embolism. 2. Bilateral lower lobe predominant patchy ground-glass opacities concerning for multifocal pneumonia. Clinical correlation and follow-up to resolution recommended. 3. Small bilateral pleural effusions. 4. Mild bilateral hilar and subcarinal adenopathy, likely reactive. 5. Indeterminate 4.1 x 4.1 cm rounded lesion in the spleen. Further evaluation with ultrasound on a nonemergent/outpatient basis recommended. Electronically Signed   By: Elgie Collard M.D.   On: 07/22/2020 20:34    Medications: Scheduled Meds: . azithromycin  500 mg Oral Daily  . DULoxetine  30 mg Oral Daily  . enoxaparin (LOVENOX) injection  40 mg Subcutaneous Q24H  . folic acid  1 mg Oral Daily  . HYDROmorphone   Intravenous Q4H  . ketorolac  15 mg Intravenous Q6H  . lactulose  30 g Oral BID  . potassium chloride SA  20 mEq Oral Daily  . senna-docusate  1 tablet Oral BID   Continuous Infusions: . ceFEPime (MAXIPIME) IV 2 g (07/23/20 1124)   PRN Meds:.acetaminophen, albuterol, diphenhydrAMINE, hydrOXYzine, naloxone **AND** sodium chloride flush, ondansetron (ZOFRAN) IV, oxyCODONE, polyethylene glycol  Consultants:  None  Procedures:  None  Antibiotics:  IV ceftriaxone  Assessment/Plan: Principal Problem:   Sickle cell pain crisis (HCC) Active Problems:   Febrile illness   Acute chest syndrome due to sickle-cell disease (HCC)   Transaminitis   Hypokalemia   History of depression   Acute chest syndrome vs. CAP:  CT angiogram shows no evidence of pulmonary embolism. Shows bilateral lower lobe predominant patchy groundglass opacities  concerning for multifocal pneumonia. Continue IV antibiotics. Tylenol as needed for fever. Monitor vital signs closely. Incentive spirometer and supplemental oxygen as needed.  Sickle cell disease with pain crisis: Continue [facility] at [facility]. Also, continue IV Dilaudid PCA without any changes in settings. IV Toradol every 6 hours for total of 5 days. Continue oxycodone 5 mg every 4 hours as needed for severe breakthrough pain. Monitor vital signs very closely, reevaluate pain scale regularly, and supplemental oxygen as needed.  Leukocytosis: Stable. Continue antibiotics. Monitor very closely. Blood cultures negative so far. Labs in AM.  Sickle cell anemia: Hemoglobin is stable and consistent with patient's baseline. There is no clinical indication for blood transfusion on today. Continue to monitor closely. Follow labs in AM.  Chronic pain syndrome: Continue home medications  Hypokalemia: Resolved. Follow labs in AM. Replete as needed.  History of major depression: Patient denies any suicidal or homicidal thoughts. Continue home medications.  Code Status: Full Code Family Communication: N/A Disposition Plan: Not yet ready for discharge  Taden Witter Rennis Petty  APRN, MSN, FNP-C [facility] [address] [place], [place] [address] [phone]  If 5PM-7AM, please contact night-coverage.  07/23/2020, 5:22 PM  LOS: 4 days

## 2020-07-24 LAB — CULTURE, BLOOD (ROUTINE X 2)
Culture: NO GROWTH
Culture: NO GROWTH
Special Requests: ADEQUATE
Special Requests: ADEQUATE

## 2020-07-24 MED ORDER — HYDROMORPHONE HCL 1 MG/ML IJ SOLN
1.0000 mg | INTRAMUSCULAR | Status: DC | PRN
Start: 2020-07-24 — End: 2020-07-26
  Administered 2020-07-24 – 2020-07-25 (×2): 1 mg via INTRAVENOUS
  Filled 2020-07-24 (×3): qty 1

## 2020-07-24 MED ORDER — PROCHLORPERAZINE EDISYLATE 10 MG/2ML IJ SOLN
10.0000 mg | Freq: Four times a day (QID) | INTRAMUSCULAR | Status: DC | PRN
  Administered 2020-07-24: 21:00:00 10 mg via INTRAVENOUS
  Filled 2020-07-24: qty 2

## 2020-07-24 MED ORDER — OXYCODONE HCL 5 MG PO TABS
10.0000 mg | ORAL_TABLET | ORAL | Status: DC | PRN
  Administered 2020-07-24 – 2020-07-26 (×5): 10 mg via ORAL
  Filled 2020-07-24 (×5): qty 2

## 2020-07-24 NOTE — Progress Notes (Signed)
Subjective: Shelby Coffey is a 19 year old female with a medical history significant for sickle cell disease that was admitted for sickle cell pain crisis.  Patient states that pain has improved some overnight.  Pain intensity is 4/10 primarily to low back and lower extremities.  Patient continues to be tachycardic, heart rate range from 100-110 overnight.  Patient periodically febrile.  She denies any headache, shortness of breath, urinary symptoms, nausea, vomiting, or diarrhea.  Objective:  Vital signs in last 24 hours:  Vitals:   07/24/20 0949 07/24/20 1149 07/24/20 1408 07/24/20 1547  BP: (!) 96/58  120/70   Pulse: 99  (!) 103   Resp: 17 16 14    Temp: 99.8 F (37.7 C)  (!) 103.3 F (39.6 C) 99.5 F (37.5 C)  TempSrc: Oral  Oral   SpO2: 100% 98% 99%   Weight:      Height:        Intake/Output from previous day:   Intake/Output Summary (Last 24 hours) at 07/24/2020 1646 Last data filed at 07/24/2020 1030 Gross per 24 hour  Intake 117 ml  Output --  Net 117 ml    Physical Exam: General: Alert, awake, oriented x3, in no acute distress.  HEENT: [place]/AT PEERL, EOMI Neck: Trachea midline,  no masses, no thyromegal,y no JVD, no carotid bruit OROPHARYNX:  Moist, No exudate/ erythema/lesions.  Heart: Regular rate and rhythm, without murmurs, rubs, gallops, PMI non-displaced, no heaves or thrills on palpation.  Lungs: Clear to auscultation, no wheezing or rhonchi noted. No increased vocal fremitus resonant to percussion  Abdomen: Soft, nontender, nondistended, positive bowel sounds, no masses no hepatosplenomegaly noted..  Neuro: No focal neurological deficits noted cranial nerves II through XII grossly intact. DTRs 2+ bilaterally upper and lower extremities. Strength 5 out of 5 in bilateral upper and lower extremities. Musculoskeletal: No warm swelling or erythema around joints, no spinal tenderness noted. Psychiatric: Patient alert and oriented x3, good insight and cognition,  good recent to remote recall. Lymph node survey: No cervical axillary or inguinal lymphadenopathy noted.  Lab Results:  Basic Metabolic Panel:    Component Value Date/Time   NA 135 07/23/2020 0613   K 4.1 07/23/2020 0613   CL 104 07/23/2020 0613   CO2 22 07/23/2020 0613   BUN <5 (L) 07/23/2020 0613   CREATININE 0.51 07/23/2020 0613   GLUCOSE 104 (H) 07/23/2020 0613   CALCIUM 8.3 (L) 07/23/2020 0613   CBC:    Component Value Date/Time   WBC 13.7 (H) 07/23/2020 0613   HGB 9.5 (L) 07/23/2020 0613   HCT 25.7 (L) 07/23/2020 0613   PLT 283 07/23/2020 0613   MCV 77.9 (L) 07/23/2020 0613   NEUTROABS 8.2 (H) 07/23/2020 0613   LYMPHSABS 3.1 07/23/2020 0613   MONOABS 1.9 (H) 07/23/2020 0613   EOSABS 0.5 07/23/2020 0613   BASOSABS 0.1 07/23/2020 07/25/2020    Recent Results (from the past 240 hour(s))  Blood culture (routine x 2)     Status: None   Collection Time: 07/19/20  8:05 AM   Specimen: BLOOD RIGHT ARM  Result Value Ref Range Status   Specimen Description BLOOD RIGHT ARM  Final   Special Requests   Final    BOTTLES DRAWN AEROBIC AND ANAEROBIC Blood Culture adequate volume   Culture   Final    NO GROWTH 5 DAYS Performed at [facility], [address], [place], [place]    Report Status 07/24/2020 FINAL  Final  Resp Panel by RT-PCR (Flu  A&B, Covid) Nasopharyngeal Swab     Status: None   Collection Time: 07/19/20  8:11 AM   Specimen: Nasopharyngeal Swab; Nasopharyngeal(NP) swabs in vial transport medium  Result Value Ref Range Status   SARS Coronavirus 2 by RT PCR NEGATIVE NEGATIVE Final    Comment: (NOTE) SARS-CoV-2 target nucleic acids are NOT DETECTED.  The SARS-CoV-2 RNA is generally detectable in upper respiratory specimens during the acute phase of infection. The lowest concentration of SARS-CoV-2 viral copies this assay can detect is 138 copies/mL. A negative result does not preclude SARS-Cov-2 infection and should not be used as the sole basis for  treatment or other patient management decisions. A negative result may occur with  improper specimen collection/handling, submission of specimen other than nasopharyngeal swab, presence of viral mutation(s) within the areas targeted by this assay, and inadequate number of viral copies(<138 copies/mL). A negative result must be combined with clinical observations, patient history, and epidemiological information. The expected result is Negative.  Fact Sheet for Patients:  BloggerCourse.com  Fact Sheet for Healthcare Providers:  SeriousBroker.it  This test is no t yet approved or cleared by the [place] FDA and  has been authorized for detection and/or diagnosis of SARS-CoV-2 by FDA under an Emergency Use Authorization (EUA). This EUA will remain  in effect (meaning this test can be used) for the duration of the COVID-19 declaration under Section 564(b)(1) of the Act, 21 U.S.C.section 360bbb-3(b)(1), unless the authorization is terminated  or revoked sooner.       Influenza A by PCR NEGATIVE NEGATIVE Final   Influenza B by PCR NEGATIVE NEGATIVE Final    Comment: (NOTE) The Xpert Xpress SARS-CoV-2/FLU/RSV plus assay is intended as an aid in the diagnosis of influenza from Nasopharyngeal swab specimens and should not be used as a sole basis for treatment. Nasal washings and aspirates are unacceptable for Xpert Xpress SARS-CoV-2/FLU/RSV testing.  Fact Sheet for Patients: BloggerCourse.com  Fact Sheet for Healthcare Providers: SeriousBroker.it  This test is not yet approved or cleared by the [place] FDA and has been authorized for detection and/or diagnosis of SARS-CoV-2 by FDA under an Emergency Use Authorization (EUA). This EUA will remain in effect (meaning this test can be used) for the duration of the COVID-19 declaration under Section 564(b)(1) of the Act, 21  U.S.C. section 360bbb-3(b)(1), unless the authorization is terminated or revoked.  Performed at [facility], [address] [place], [place] [address]   Urine culture     Status: Abnormal   Collection Time: 07/19/20 11:15 AM   Specimen: Urine, Random  Result Value Ref Range Status   Specimen Description URINE, RANDOM  Final   Special Requests NONE  Final   Culture (A)  Final    <10,000 COLONIES/mL INSIGNIFICANT GROWTH Performed at [facility], [address], [place], [place] [address]    Report Status 07/20/2020 FINAL  Final  Blood culture (routine x 2)     Status: None   Collection Time: 07/19/20 11:30 AM   Specimen: BLOOD LEFT WRIST  Result Value Ref Range Status   Specimen Description BLOOD LEFT WRIST  Final   Special Requests   Final    BOTTLES DRAWN AEROBIC AND ANAEROBIC Blood Culture adequate volume   Culture   Final    NO GROWTH 5 DAYS Performed at [facility], [address], [place], [place] [address]    Report Status 07/24/2020 FINAL  Final    Studies/Results: CT ANGIO CHEST PE  W OR WO CONTRAST  Result Date: 07/22/2020 CLINICAL DATA:  19 year old female with concern for pulmonary embolism. EXAM: CT ANGIOGRAPHY CHEST WITH CONTRAST TECHNIQUE: Multidetector CT imaging of the chest was performed using the standard protocol during bolus administration of intravenous contrast. Multiplanar CT image reconstructions and MIPs were obtained to evaluate the vascular anatomy. CONTRAST:  OMNIPAQUE IOHEXOL 350 MG/ML SOLN COMPARISON:  Chest radiograph dated 07/20/2020. FINDINGS: Cardiovascular: There is no cardiomegaly or pericardial effusion. The thoracic aorta is unremarkable. Evaluation of the pulmonary arteries is somewhat limited due to suboptimal opacification. No pulmonary artery embolus identified. Mediastinum/Nodes: Mild bilateral hilar and subcarinal adenopathy. The esophagus and the thyroid gland are grossly unremarkable. No mediastinal fluid  collection. Lungs/Pleura: Bilateral lower lobe predominant patchy ground-glass opacities concerning for multifocal pneumonia. Clinical correlation and follow-up to resolution recommended. There are small bilateral pleural effusions. No pneumothorax. The central airways are patent. Upper Abdomen: Indeterminate 4.1 x 4.1 cm rounded lesion in the spleen. Further evaluation with ultrasound on a nonemergent/outpatient basis recommended. Musculoskeletal: No acute osseous pathology. Scoliosis. Review of the MIP images confirms the above findings. IMPRESSION: 1. No CT evidence of pulmonary embolism. 2. Bilateral lower lobe predominant patchy ground-glass opacities concerning for multifocal pneumonia. Clinical correlation and follow-up to resolution recommended. 3. Small bilateral pleural effusions. 4. Mild bilateral hilar and subcarinal adenopathy, likely reactive. 5. Indeterminate 4.1 x 4.1 cm rounded lesion in the spleen. Further evaluation with ultrasound on a nonemergent/outpatient basis recommended. Electronically Signed   By: Elgie Collard M.D.   On: 07/22/2020 20:34    Medications: Scheduled Meds: . azithromycin  500 mg Oral Daily  . DULoxetine  30 mg Oral Daily  . enoxaparin (LOVENOX) injection  40 mg Subcutaneous Q24H  . folic acid  1 mg Oral Daily  . potassium chloride SA  20 mEq Oral Daily  . senna-docusate  1 tablet Oral BID   Continuous Infusions: . ceFEPime (MAXIPIME) IV 2 g (07/24/20 1153)   PRN Meds:.acetaminophen, albuterol, HYDROmorphone (DILAUDID) injection, hydrOXYzine, oxyCODONE, polyethylene glycol  Consultants:  None  Procedures:  None  Antibiotics:  IV Ceftriaxone  Assessment/Plan: Principal Problem:   Sickle cell pain crisis (HCC) Active Problems:   Febrile illness   Acute chest syndrome due to sickle-cell disease (HCC)   Transaminitis   Hypokalemia   History of depression  Acute chest syndrome versus CAP: Continue IV antibiotics.  Tylenol as needed for  fever.  Monitor vital signs closely.  Continue incentive spirometry and supplemental oxygen as needed.  Sickle cell disease with pain crisis: Discontinue IV dilaudid PCA. Increase oxycodone to 10 mg every 4 hours as needed Continue IV Toradol every 6 hours for total of 5 days. Continue to monitor vital signs closely, reevaluate pain scale regularly, and supplemental oxygen as needed.  Leukocytosis: Stable.  Patient periodically febrile.  Continue IV antibiotics.  Blood cultures negative.  Repeat CBC in a.m.  Sickle cell anemia: Hemoglobin is stable and consistent with patient's baseline.  There is no clinical indication for blood transfusion on today.  Continue to monitor closely.  Chronic pain syndrome: Continue home medications  Hypokalemia: Resolved.  Replete as needed.  Follow labs in AM.  History of major depression: Patient denies any suicidal homicidal thoughts.  Continue home medications.  Code Status: Full Code Family Communication: N/A Disposition Plan: Not yet ready for discharge  Nolon Nations  APRN, MSN, FNP-C [facility] [address] [place], [place] [address] [phone]  If 5PM-7AM, please  contact night-coverage.  07/24/2020, 4:46 PM  LOS: 5 days

## 2020-07-24 NOTE — Progress Notes (Signed)
Went to answer patient's call light because her pump was beeping. Found patient's HR to be fluctuating between 140s-160s. Patient at rest in the bed at this time. No activity noted. Obtained VS.  MEWS score of 5. Notified Katherina Right, [place]. APP of situation. Stated she'll order EKG and patient can have toradol to help with the fever. Relayed message to charge nurse and patient's nurse. Patient HR currently maintaining 130s at rest.

## 2020-07-25 DIAGNOSIS — D5700 Hb-SS disease with crisis, unspecified: Secondary | ICD-10-CM | POA: Diagnosis not present

## 2020-07-25 LAB — CBC WITH DIFFERENTIAL/PLATELET
Abs Immature Granulocytes: 0.17 10*3/uL — ABNORMAL HIGH (ref 0.00–0.07)
Basophils Absolute: 0.1 10*3/uL (ref 0.0–0.1)
Basophils Relative: 1 %
Eosinophils Absolute: 0.4 10*3/uL (ref 0.0–0.5)
Eosinophils Relative: 4 %
HCT: 24.3 % — ABNORMAL LOW (ref 36.0–46.0)
Hemoglobin: 9.1 g/dL — ABNORMAL LOW (ref 12.0–15.0)
Immature Granulocytes: 2 %
Lymphocytes Relative: 30 %
Lymphs Abs: 3.3 10*3/uL (ref 0.7–4.0)
MCH: 28.4 pg (ref 26.0–34.0)
MCHC: 37.4 g/dL — ABNORMAL HIGH (ref 30.0–36.0)
MCV: 75.9 fL — ABNORMAL LOW (ref 80.0–100.0)
Monocytes Absolute: 0.7 10*3/uL (ref 0.1–1.0)
Monocytes Relative: 6 %
Neutro Abs: 6.3 10*3/uL (ref 1.7–7.7)
Neutrophils Relative %: 57 %
Platelets: 355 10*3/uL (ref 150–400)
RBC: 3.2 MIL/uL — ABNORMAL LOW (ref 3.87–5.11)
RDW: 14.7 % (ref 11.5–15.5)
WBC: 11 10*3/uL — ABNORMAL HIGH (ref 4.0–10.5)
nRBC: 1.1 % — ABNORMAL HIGH (ref 0.0–0.2)

## 2020-07-25 MED ORDER — AMOXICILLIN-POT CLAVULANATE 875-125 MG PO TABS
1.0000 | ORAL_TABLET | Freq: Two times a day (BID) | ORAL | Status: DC
  Administered 2020-07-25 – 2020-07-26 (×2): 1 via ORAL
  Filled 2020-07-25: qty 1

## 2020-07-25 NOTE — Progress Notes (Signed)
   07/25/20 0205  Assess: MEWS Score  Temp (!) 100.4 F (38 C)  BP (!) 98/51  Pulse Rate (!) 116  Resp 14  SpO2 95 %  O2 Device Nasal Cannula  Assess: MEWS Score  MEWS Temp 0  MEWS Systolic 1  MEWS Pulse 2  MEWS RR 0  MEWS LOC 0  MEWS Score 3  MEWS Score Color Yellow  Assess: if the MEWS score is Yellow or Red  Were vital signs taken at a resting state? Yes  Focused Assessment No change from prior assessment  Early Detection of Sepsis Score *See Row Information* Medium  MEWS guidelines implemented *See Row Information* No, previously red, continue vital signs every 4 hours  Treat  MEWS Interventions Administered prn meds/treatments  Pain Scale 0-10  Pain Score 4  Pain Type Acute pain  Pain Location Head  Take Vital Signs  Increase Vital Sign Frequency  Yellow: Q 2hr X 2 then Q 4hr X 2, if remains yellow, continue Q 4hrs  Escalate  MEWS: Escalate Yellow: discuss with charge nurse/RN and consider discussing with provider and RRT  Notify: Charge Nurse/RN  Name of Charge Nurse/RN Notified Vera, RN  Date Charge Nurse/RN Notified 07/25/20  Time Charge Nurse/RN Notified 0217

## 2020-07-25 NOTE — Progress Notes (Signed)
Subjective: Shelby Coffey is a 19 year old female with a medical history significant for sickle cell disease that was admitted for sickle cell pain crisis.  Patient says that pain intensity has improved overnight. She has no new complaints on today. Patient rates pain as 3/10. She denies any headache, chest pain, shortness of breath, urinary symptoms, nausea, vomiting, or diarrhea.  Patient has been ambulating in room without assistance.  Objective:  Vital signs in last 24 hours:  Vitals:   07/25/20 0420 07/25/20 0603 07/25/20 1045 07/25/20 1320  BP: (!) 96/53 (!) 96/56 132/82 114/62  Pulse: 80 73 (!) 109 90  Resp:  16 16 15   Temp: 99 F (37.2 C) 97.6 F (36.4 C) 99.9 F (37.7 C) 99.4 F (37.4 C)  TempSrc: Oral Oral Oral Oral  SpO2: 99% 99% 97% 95%  Weight:      Height:        Intake/Output from previous day:   Intake/Output Summary (Last 24 hours) at 07/25/2020 1427 Last data filed at 07/25/2020 1300 Gross per 24 hour  Intake 240 ml  Output 800 ml  Net -560 ml    Physical Exam: General: Alert, awake, oriented x3, in no acute distress.  HEENT: [place]/AT PEERL, EOMI Neck: Trachea midline,  no masses, no thyromegal,y no JVD, no carotid bruit OROPHARYNX:  Moist, No exudate/ erythema/lesions.  Heart: Regular rate and rhythm, without murmurs, rubs, gallops, PMI non-displaced, no heaves or thrills on palpation.  Lungs: Clear to auscultation, no wheezing or rhonchi noted. No increased vocal fremitus resonant to percussion  Abdomen: Soft, nontender, nondistended, positive bowel sounds, no masses no hepatosplenomegaly noted..  Neuro: No focal neurological deficits noted cranial nerves II through XII grossly intact. DTRs 2+ bilaterally upper and lower extremities. Strength 5 out of 5 in bilateral upper and lower extremities. Musculoskeletal: No warm swelling or erythema around joints, no spinal tenderness noted. Psychiatric: Patient alert and oriented x3, good insight and cognition,  good recent to remote recall. Lymph node survey: No cervical axillary or inguinal lymphadenopathy noted.  Lab Results:  Basic Metabolic Panel:    Component Value Date/Time   NA 135 07/23/2020 0613   K 4.1 07/23/2020 0613   CL 104 07/23/2020 0613   CO2 22 07/23/2020 0613   BUN <5 (L) 07/23/2020 0613   CREATININE 0.51 07/23/2020 0613   GLUCOSE 104 (H) 07/23/2020 0613   CALCIUM 8.3 (L) 07/23/2020 0613   CBC:    Component Value Date/Time   WBC 11.0 (H) 07/25/2020 0714   HGB 9.1 (L) 07/25/2020 0714   HCT 24.3 (L) 07/25/2020 0714   PLT 355 07/25/2020 0714   MCV 75.9 (L) 07/25/2020 0714   NEUTROABS 6.3 07/25/2020 0714   LYMPHSABS 3.3 07/25/2020 0714   MONOABS 0.7 07/25/2020 0714   EOSABS 0.4 07/25/2020 0714   BASOSABS 0.1 07/25/2020 0714    Recent Results (from the past 240 hour(s))  Blood culture (routine x 2)     Status: None   Collection Time: 07/19/20  8:05 AM   Specimen: BLOOD RIGHT ARM  Result Value Ref Range Status   Specimen Description BLOOD RIGHT ARM  Final   Special Requests   Final    BOTTLES DRAWN AEROBIC AND ANAEROBIC Blood Culture adequate volume   Culture   Final    NO GROWTH 5 DAYS Performed at [facility], [address] [place], [place]    Report Status 07/24/2020 FINAL  Final  Resp Panel by RT-PCR (Flu A&B, Covid)  Nasopharyngeal Swab     Status: None   Collection Time: 07/19/20  8:11 AM   Specimen: Nasopharyngeal Swab; Nasopharyngeal(NP) swabs in vial transport medium  Result Value Ref Range Status   SARS Coronavirus 2 by RT PCR NEGATIVE NEGATIVE Final    Comment: (NOTE) SARS-CoV-2 target nucleic acids are NOT DETECTED.  The SARS-CoV-2 RNA is generally detectable in upper respiratory specimens during the acute phase of infection. The lowest concentration of SARS-CoV-2 viral copies this assay can detect is 138 copies/mL. A negative result does not preclude SARS-Cov-2 infection and should not be used as the sole basis for treatment  or other patient management decisions. A negative result may occur with  improper specimen collection/handling, submission of specimen other than nasopharyngeal swab, presence of viral mutation(s) within the areas targeted by this assay, and inadequate number of viral copies(<138 copies/mL). A negative result must be combined with clinical observations, patient history, and epidemiological information. The expected result is Negative.  Fact Sheet for Patients:  BloggerCourse.com  Fact Sheet for Healthcare Providers:  SeriousBroker.it  This test is no t yet approved or cleared by the [place] FDA and  has been authorized for detection and/or diagnosis of SARS-CoV-2 by FDA under an Emergency Use Authorization (EUA). This EUA will remain  in effect (meaning this test can be used) for the duration of the COVID-19 declaration under Section 564(b)(1) of the Act, 21 U.S.C.section 360bbb-3(b)(1), unless the authorization is terminated  or revoked sooner.       Influenza A by PCR NEGATIVE NEGATIVE Final   Influenza B by PCR NEGATIVE NEGATIVE Final    Comment: (NOTE) The Xpert Xpress SARS-CoV-2/FLU/RSV plus assay is intended as an aid in the diagnosis of influenza from Nasopharyngeal swab specimens and should not be used as a sole basis for treatment. Nasal washings and aspirates are unacceptable for Xpert Xpress SARS-CoV-2/FLU/RSV testing.  Fact Sheet for Patients: BloggerCourse.com  Fact Sheet for Healthcare Providers: SeriousBroker.it  This test is not yet approved or cleared by the [place] FDA and has been authorized for detection and/or diagnosis of SARS-CoV-2 by FDA under an Emergency Use Authorization (EUA). This EUA will remain in effect (meaning this test can be used) for the duration of the COVID-19 declaration under Section 564(b)(1) of the Act, 21 U.S.C. section  360bbb-3(b)(1), unless the authorization is terminated or revoked.  Performed at [facility], [address] [place], [place] [address]   Urine culture     Status: Abnormal   Collection Time: 07/19/20 11:15 AM   Specimen: Urine, Random  Result Value Ref Range Status   Specimen Description URINE, RANDOM  Final   Special Requests NONE  Final   Culture (A)  Final    <10,000 COLONIES/mL INSIGNIFICANT GROWTH Performed at [facility], [address] [place], [place] [address]    Report Status 07/20/2020 FINAL  Final  Blood culture (routine x 2)     Status: None   Collection Time: 07/19/20 11:30 AM   Specimen: BLOOD LEFT WRIST  Result Value Ref Range Status   Specimen Description BLOOD LEFT WRIST  Final   Special Requests   Final    BOTTLES DRAWN AEROBIC AND ANAEROBIC Blood Culture adequate volume   Culture   Final    NO GROWTH 5 DAYS Performed at [facility], [address] [place], [place] [address]    Report Status 07/24/2020 FINAL  Final    Studies/Results: No results found.  Medications: Scheduled  Meds: . amoxicillin-clavulanate  1 tablet Oral Q12H  . DULoxetine  30 mg Oral Daily  . enoxaparin (LOVENOX) injection  40 mg Subcutaneous Q24H  . folic acid  1 mg Oral Daily  . potassium chloride SA  20 mEq Oral Daily  . senna-docusate  1 tablet Oral BID   Continuous Infusions: PRN Meds:.acetaminophen, albuterol, HYDROmorphone (DILAUDID) injection, hydrOXYzine, oxyCODONE, polyethylene glycol, prochlorperazine  Consultants:  None  Procedures:  None  Antibiotics:  Augmentin  Assessment/Plan: Principal Problem:   Sickle cell pain crisis (HCC) Active Problems:   Febrile illness   Acute chest syndrome due to sickle-cell disease (HCC)   Transaminitis   Hypokalemia   History of depression   Acute chest syndrome vs CAP:  Discontinue IV antibiotics.  Transition to Augmentin 875-125 mg every 12 hours.  Continue Tylenol as needed for fever.   Monitor vital signs closely.  Continue incentive spirometry and supplemental oxygen as needed. Ambulating pulse oximetry  Sickle cell disease with pain crisis: Continue oxycodone 10 mg every 4 hours as needed IV Toradol every 6 hours for total of 5 days Continue to monitor vital signs closely, reevaluate pain scale regularly, and supplemental oxygen as needed.  Leukocytosis: Stable.  Patient transition to oral antibiotics.  Blood cultures continue to be negative.  Follow labs.  Sickle cell anemia: Hemoglobin 9.5, consistent with patient's baseline.  There is no clinical indication for blood transfusion at this time.  Continue to monitor closely.  Chronic pain syndrome: Continue home medications  Hypokalemia: Resolved.  Replete as needed.  Follow labs in AM.  History of major depression: Patient denies any suicidal or homicidal thoughts.  Continue home medications.  Code Status: Full Code Family Communication: N/A Disposition Plan: Not yet ready for discharge   If 5PM-8AM, please contact night-coverage.  Nolon Nations  APRN, MSN, FNP-C Patient Care [facility] [address] [place], [place] [address] [phone]   07/25/2020, 2:27 PM  LOS: 6 days

## 2020-07-26 DIAGNOSIS — D5700 Hb-SS disease with crisis, unspecified: Secondary | ICD-10-CM | POA: Diagnosis not present

## 2020-07-26 LAB — COMPREHENSIVE METABOLIC PANEL
ALT: 104 U/L — ABNORMAL HIGH (ref 0–44)
AST: 146 U/L — ABNORMAL HIGH (ref 15–41)
Albumin: 2.9 g/dL — ABNORMAL LOW (ref 3.5–5.0)
Alkaline Phosphatase: 59 U/L (ref 38–126)
Anion gap: 11 (ref 5–15)
BUN: <5 mg/dL — ABNORMAL LOW (ref 6–20)
CO2: 22 mmol/L (ref 22–32)
Calcium: 8.4 mg/dL — ABNORMAL LOW (ref 8.9–10.3)
Chloride: 102 mmol/L (ref 98–111)
Creatinine, Ser: 0.61 mg/dL (ref 0.44–1.00)
GFR, Estimated: >60 mL/min (ref >60)
Glucose, Bld: 117 mg/dL — ABNORMAL HIGH (ref 70–99)
Potassium: 3.4 mmol/L — ABNORMAL LOW (ref 3.5–5.1)
Sodium: 135 mmol/L (ref 135–145)
Total Bilirubin: 1.5 mg/dL — ABNORMAL HIGH (ref 0.3–1.2)
Total Protein: 6.6 g/dL (ref 6.5–8.1)

## 2020-07-26 MED ORDER — OXYCODONE HCL 5 MG PO TABS: 5.0000 mg | ORAL_TABLET | ORAL | 0 refills | Status: DC | PRN

## 2020-07-26 MED ORDER — AMOXICILLIN-POT CLAVULANATE 875-125 MG PO TABS: 1.0000 | ORAL_TABLET | Freq: Two times a day (BID) | ORAL | 0 refills | Status: AC

## 2020-07-26 MED ORDER — IBUPROFEN 800 MG PO TABS: 800.0000 mg | ORAL_TABLET | Freq: Three times a day (TID) | ORAL | 0 refills | Status: DC

## 2020-07-26 NOTE — Discharge Instructions (Signed)
Sickle Cell Anemia, Adult  Sickle cell anemia is a condition where your red blood cells are shaped like sickles. Red blood cells carry oxygen through the body. Sickle-shaped cells do not live as long as normal red blood cells. They also clump together and block blood from flowing through the blood vessels. This prevents the body from getting enough oxygen. Sickle cell anemia causes organ damage and pain. It also increases the risk of infection. Follow these instructions at home: Medicines  Take over-the-counter and prescription medicines only as told by your doctor.  If you were prescribed an antibiotic medicine, take it as told by your doctor. Do not stop taking the antibiotic even if you start to feel better.  If you develop a fever, do not take medicines to lower the fever right away. Tell your doctor about the fever. Managing pain, stiffness, and swelling  Try these methods to help with pain: ? Use a heating pad. ? Take a warm bath. ? Distract yourself, such as by watching TV. Eating and drinking  Drink enough fluid to keep your pee (urine) clear or pale yellow. Drink more in hot weather and during exercise.  Limit or avoid alcohol.  Eat a healthy diet. Eat plenty of fruits, vegetables, whole grains, and lean protein.  Take vitamins and supplements as told by your doctor. Traveling  When traveling, keep these with you: ? Your medical information. ? The names of your doctors. ? Your medicines.  If you need to take an airplane, talk to your doctor first. Activity  Rest often.  Avoid exercises that make your heart beat much faster, such as jogging. General instructions  Do not use products that have nicotine or tobacco, such as cigarettes and e-cigarettes. If you need help quitting, ask your doctor.  Consider wearing a medical alert bracelet.  Avoid being in high places (high altitudes), such as mountains.  Avoid very hot or cold temperatures.  Avoid places where the  temperature changes a lot.  Keep all follow-up visits as told by your doctor. This is important. Contact a doctor if:  A joint hurts.  Your feet or hands hurt or swell.  You feel tired (fatigued). Get help right away if:  You have symptoms of infection. These include: ? Fever. ? Chills. ? Being very tired. ? Irritability. ? Poor eating. ? Throwing up (vomiting).  You feel dizzy or faint.  You have new stomach pain, especially on the left side.  You have a an erection (priapism) that lasts more than 4 hours.  You have numbness in your arms or legs.  You have a hard time moving your arms or legs.  You have trouble talking.  You have pain that does not go away when you take medicine.  You are short of breath.  You are breathing fast.  You have a long-term cough.  You have pain in your chest.  You have a bad headache.  You have a stiff neck.  Your stomach looks bloated even though you did not eat much.  Your skin is pale.  You suddenly cannot see well. Summary  Sickle cell anemia is a condition where your red blood cells are shaped like sickles.  Follow your doctor's advice on ways to manage pain, food to eat, activities to do, and steps to take for safe travel.  Get medical help right away if you have any signs of infection, such as a fever. This information is not intended to replace advice given to you by   your health care provider. Make sure you discuss any questions you have with your health care provider. Document Revised: 11/04/2018 Document Reviewed: 08/18/2016 Elsevier Patient Education  2020 Elsevier Inc.   Fever, Adult     A fever is an increase in your body's temperature. It often means a temperature of 100.22F (38C) or higher. Brief mild or moderate fevers often have no long-term effects. They often do not need treatment. Moderate or high fevers may make you feel uncomfortable. Sometimes, they can be a sign of a serious illness or disease.  A fever that keeps coming back or that lasts a long time may cause you to lose water in your body (get dehydrated). You can take your temperature with a thermometer to see if you have a fever. Temperature can change with:  Age.  Time of day.  Where the thermometer is put in the body. Readings may vary when the thermometer is put: ? In the mouth (oral). ? In the butt (rectal). ? In the ear (tympanic). ? Under the arm (axillary). ? On the forehead (temporal). Follow these instructions at home: Medicines  Take over-the-counter and prescription medicines only as told by your doctor. Follow the dosing instructions carefully.  If you were prescribed an antibiotic medicine, take it as told by your doctor. Do not stop taking it even if you start to feel better. General instructions  Watch for any changes in your symptoms. Tell your doctor about them.  Rest as needed.  Drink enough fluid to keep your pee (urine) pale yellow.  Sponge yourself or bathe with room-temperature water as needed. This helps to lower your body temperature. Do not use ice water.  Do not use too many blankets or wear clothes that are too heavy.  If your fever was caused by an infection that spreads from person to person (is contagious), such as a cold or the flu: ? You should stay home from work and public places for at least 24 hours after your fever is gone. ? Your fever should be gone for at least 24 hours without the need to use medicines. Contact a doctor if:  You throw up (vomit).  You cannot eat or drink without throwing up.  You have watery poop (diarrhea).  It hurts when you pee.  Your symptoms do not get better with treatment.  You have new symptoms.  You feel very weak. Get help right away if:  You are short of breath or have trouble breathing.  You are dizzy or you pass out (faint).  You feel mixed up (confused).  You have signs of not having enough water in your body, such  as: ? Dark pee, very little pee, or no pee. ? Cracked lips. ? Dry mouth. ? Sunken eyes. ? Sleepiness. ? Weakness.  You have very bad pain in your belly (abdomen).  You keep throwing up or having watery poop.  You have a rash on your skin.  Your symptoms get worse all of a sudden. Summary  A fever is an increase in your body's temperature. It often means a temperature of 100.22F (38C) or higher.  Watch for any changes in your symptoms. Tell your doctor about them.  Take all medicines only as told by your doctor.  Do not go to work or other public places if your fever was caused by an illness that can spread to other people.  Get help right away if you have signs that you do not have enough water in your  body. This information is not intended to replace advice given to you by your health care provider. Make sure you discuss any questions you have with your health care provider. Document Revised: 12/27/2017 Document Reviewed: 12/27/2017 Elsevier Patient Education  2020 [employer].

## 2020-07-26 NOTE — Discharge Summary (Signed)
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 [name] is instructed to increase activity slowly and balance with rest for the next few days, and use prescribed medication to complete treatment of pain  Diet: Regular Wt Readings from Last 3 Encounters:  07/23/20 69.6 kg  03/20/20 63.6 kg (70 %, Z= 0.52)*  03/19/20 59 kg (54 %, Z= 0.10)*   * Growth percentiles are based on CDC (Girls, 2-20 Years) data.    History of present illness:  Shelby Coffey  is a 19 y.o. female with a medical history significant for sickle cell disease type [place] presented to the emergency department with complaints of generalized body aches and pains that are similar to her previous sickle cell crisis.  Patient typically has very well-controlled sickle cell disease with infrequent pain crisis.  She says that she is not on any regular pain medications.  Patient is opiate nave.  She has been attempting to manage at home with Tylenol without success.  Patient attributes pain crisis to celebrating her birthday several days ago.  Patient states that she felt dehydrated following her birthday party.  Patient endorses chest pain, bilateral upper and lower extremity pain, and low back pain.  Pain intensity is 6/10 characterized as intermittent and aching.  Patient denies any headache, chest pain, shortness of breath, urinary symptoms, nausea, vomiting, or diarrhea.  No recent travel, sick contacts, or  exposure to COVID-19.  ER course: While at [facility] emergency department vital signs were: Temperature 102.9, pulse rate 122, respirations 16, blood pressure 119/80, and oxygen saturation 98% on room air.  Chest x-ray showed no acute cardiopulmonary process.  Rocephin and oral azithromycin initiated.  Lactic acid elevated at 3.1.  Respiratory panel unremarkable.  Lipase negative.  Urinalysis unremarkable.  Beta hCG negative.  WBCs 8.3.  Hemoglobin 10.6, consistent with patient's baseline.  Complete metabolic panel shows potassium of 2.9, runs of potassium initiated.  Creatinine 0.78, AST 81, ALT 49.  Sickle cell team notified for admission.  Patient admitted for fever of unknown origin in the setting of sickle cell pain crisis.  Hospital Course:  Sickle cell disease with pain crisis.  Patient was admitted for sickle cell pain crisis and managed appropriately with IVF, IV Dilaudid via PCA and IV Toradol, as well as other adjunct therapies per sickle cell pain management protocols. Patient is opiate nave, continued IV Dilaudid PCA per full dose throughout admission. Weaned appropriately. Patient transition to oxycodone 5 mg every 4 hours as needed for severe breakthrough pain. Patient states that she is not having any pain at this time. She also does not have any pain medications at home. Oxycodone 5 mg #20 and ibuprofen 800 mg #30 was sent to patient's pharmacy. PDMP reviewed prior to prescribing opiate medications. No inconsistencies noted. Patient is not having pain and is requesting discharge home.  Possible acute chest syndrome versus community-acquired pneumonia: Chest x-ray on 07/20/2020 showed subtle heterogeneous airspace opacity at the bilateral lung bases that were concerning  for infection. IV antibiotics were initiated at that time. Patient continued periodic fevers and tachycardia. CT angiogram showed no evidence of pulmonary embolism, bilateral lower lobe predominant patchy groundglass  opacities concerning for multifocal pneumonia, small bilateral pleural effusions, mild bilateral hilar and subcarinal adenopathy, likely reactive, and indeterminate 4.1 x 4.1 cm rounded lesion in the spleen. IV cefepime and azithromycin were continued throughout admission. Patient was transitioned to Augmentin 875-125 mg every 12 hours on 07/25/2020. Patient tolerated well overnight. She will continue treatment with Augmentin No. 12 sent to patient's pharmacy.  Patient is alert, oriented, and ambulating without assistance. Patient advised to follow-up with PCP in 1 week to repeat CBC with differential and CMP. She expressed understanding.  Patient was therefore discharged home today in a hemodynamically stable condition.   [name] will follow-up with PCP within 1 week of this discharge. [name] was counseled extensively about nonpharmacologic means of pain management, patient verbalized understanding and was appreciative of  the care received during this admission.   We discussed the need for good hydration, monitoring of hydration status, avoidance of heat, cold, stress, and infection triggers.  Patient was reminded of the need to seek medical attention immediately if any symptom of bleeding, anemia, or infection occurs.  Discharge Exam: Vitals:   07/26/20 0636 07/26/20 1011  BP: 103/66 105/62  Pulse: 98 72  Resp: 15 16  Temp: 97.9 F (36.6 C) 98.6 F (37 C)  SpO2: 97% 98%   Vitals:   07/26/20 0034 07/26/20 0432 07/26/20 0636 07/26/20 1011  BP: 112/75 (!) 108/56 103/66 105/62  Pulse: (!) 107 (!) 109 98 72  Resp: 18 18 15 16   Temp: 98.2 F (36.8 C) (!) 100.5 F (38.1 C) 97.9 F (36.6 C) 98.6 F (37 C)  TempSrc: Oral Oral Oral Oral  SpO2: 97% 95% 97% 98%  Weight:      Height:        General appearance : Awake, alert, not in any distress. Speech Clear. Not toxic looking HEENT: Atraumatic and Normocephalic, pupils equally reactive to light and accomodation Neck: Supple, no JVD. No  cervical lymphadenopathy.  Chest: Good air entry bilaterally, no added sounds  CVS: S1 S2 regular, no murmurs.  Abdomen: Bowel sounds present, Non tender and not distended with no gaurding, rigidity or rebound. Extremities: B/L Lower Ext shows no edema, both legs are warm to touch Neurology: Awake alert, and oriented X 3, CN II-XII intact, Non focal Skin: No Rash  Discharge Instructions  Discharge Instructions    Discharge patient   Complete by: As directed    Discharge disposition: 01-Home or Self Care   Discharge patient date: 07/26/2020     Allergies as of 07/26/2020   No Known Allergies     Medication List    TAKE these medications   acetaminophen 325 MG tablet Commonly known as: TYLENOL Take 325-650 mg by mouth every 6 (six) hours as needed (for pain or headaches).   albuterol 108 (90 Base) MCG/ACT inhaler Commonly known as: VENTOLIN HFA Inhale 2 puffs into the lungs every 6 (six) hours as needed for wheezing or shortness of breath.   amoxicillin-clavulanate 875-125 MG tablet Commonly known as: AUGMENTIN Take 1 tablet by mouth every 12 (twelve) hours for 6 days.   amphetamine-dextroamphetamine 15 MG tablet Commonly known as: ADDERALL Take 15 mg by mouth daily.   DULoxetine 30 MG capsule Commonly known as: CYMBALTA Take 30 mg by mouth daily.   etonogestrel 68 MG Impl implant Commonly known as: NEXPLANON  68 mg by Subdermal route once.   hydrOXYzine 25 MG tablet Commonly known as: ATARAX/VISTARIL Take 25 mg by mouth daily as needed for anxiety.   ibuprofen 800 MG tablet Commonly known as: ADVIL Take 1 tablet (800 mg total) by mouth 3 (three) times daily.   oxyCODONE 5 MG immediate release tablet Commonly known as: Oxy IR/ROXICODONE Take 1 tablet (5 mg total) by mouth every 4 (four) hours as needed for severe pain.       The results of significant diagnostics from this hospitalization (including imaging, microbiology, ancillary and laboratory) are  listed below for reference.    Significant Diagnostic Studies: DG Chest 2 View  Result Date: 07/20/2020 CLINICAL DATA:  Sickle cell crisis, chest pain, body aches EXAM: CHEST - 2 VIEW COMPARISON:  07/19/2020 FINDINGS: The heart size and mediastinal contours are within normal limits. Subtle heterogeneous airspace opacities at the bilateral lung bases. Dextroscoliosis of the thoracic spine IMPRESSION: Subtle heterogeneous airspace opacities at the bilateral lung bases, concerning for infection. Electronically Signed   By: Lauralyn Primes M.D.   On: 07/20/2020 13:52   CT ANGIO CHEST PE W OR WO CONTRAST  Result Date: 07/22/2020 CLINICAL DATA:  19 year old female with concern for pulmonary embolism. EXAM: CT ANGIOGRAPHY CHEST WITH CONTRAST TECHNIQUE: Multidetector CT imaging of the chest was performed using the standard protocol during bolus administration of intravenous contrast. Multiplanar CT image reconstructions and MIPs were obtained to evaluate the vascular anatomy. CONTRAST:  OMNIPAQUE IOHEXOL 350 MG/ML SOLN COMPARISON:  Chest radiograph dated 07/20/2020. FINDINGS: Cardiovascular: There is no cardiomegaly or pericardial effusion. The thoracic aorta is unremarkable. Evaluation of the pulmonary arteries is somewhat limited due to suboptimal opacification. No pulmonary artery embolus identified. Mediastinum/Nodes: Mild bilateral hilar and subcarinal adenopathy. The esophagus and the thyroid gland are grossly unremarkable. No mediastinal fluid collection. Lungs/Pleura: Bilateral lower lobe predominant patchy ground-glass opacities concerning for multifocal pneumonia. Clinical correlation and follow-up to resolution recommended. There are small bilateral pleural effusions. No pneumothorax. The central airways are patent. Upper Abdomen: Indeterminate 4.1 x 4.1 cm rounded lesion in the spleen. Further evaluation with ultrasound on a nonemergent/outpatient basis recommended. Musculoskeletal: No acute  osseous pathology. Scoliosis. Review of the MIP images confirms the above findings. IMPRESSION: 1. No CT evidence of pulmonary embolism. 2. Bilateral lower lobe predominant patchy ground-glass opacities concerning for multifocal pneumonia. Clinical correlation and follow-up to resolution recommended. 3. Small bilateral pleural effusions. 4. Mild bilateral hilar and subcarinal adenopathy, likely reactive. 5. Indeterminate 4.1 x 4.1 cm rounded lesion in the spleen. Further evaluation with ultrasound on a nonemergent/outpatient basis recommended. Electronically Signed   By: Elgie Collard M.D.   On: 07/22/2020 20:34   DG Chest Portable 1 View  Result Date: 07/19/2020 CLINICAL DATA:  Sickle cell crisis, BILATERAL arm and leg pain, fever, chest tightness with expiration EXAM: PORTABLE CHEST 1 VIEW COMPARISON:  Portable exam 0749 hours compared 03/19/2020 FINDINGS: Normal heart size, mediastinal contours, and pulmonary vascularity. Lungs clear. No acute infiltrate, pleural effusion, or pneumothorax. Dextroconvex thoracic scoliosis. IMPRESSION: No acute abnormalities. Electronically Signed   By: Ulyses Southward M.D.   On: 07/19/2020 08:16    Microbiology: Recent Results (from the past 240 hour(s))  Blood culture (routine x 2)     Status: None   Collection Time: 07/19/20  8:05 AM   Specimen: BLOOD RIGHT ARM  Result Value Ref Range Status   Specimen Description BLOOD RIGHT ARM  Final   Special Requests   Final  BOTTLES DRAWN AEROBIC AND ANAEROBIC Blood Culture adequate volume   Culture   Final    NO GROWTH 5 DAYS Performed at [facility], [address] [place], [place] [address]    Report Status 07/24/2020 FINAL  Final  Resp Panel by RT-PCR (Flu A&B, Covid) Nasopharyngeal Swab     Status: None   Collection Time: 07/19/20  8:11 AM   Specimen: Nasopharyngeal Swab; Nasopharyngeal(NP) swabs in vial transport medium  Result Value Ref Range Status   SARS Coronavirus 2 by RT PCR NEGATIVE NEGATIVE  Final    Comment: (NOTE) SARS-CoV-2 target nucleic acids are NOT DETECTED.  The SARS-CoV-2 RNA is generally detectable in upper respiratory specimens during the acute phase of infection. The lowest concentration of SARS-CoV-2 viral copies this assay can detect is 138 copies/mL. A negative result does not preclude SARS-Cov-2 infection and should not be used as the sole basis for treatment or other patient management decisions. A negative result may occur with  improper specimen collection/handling, submission of specimen other than nasopharyngeal swab, presence of viral mutation(s) within the areas targeted by this assay, and inadequate number of viral copies(<138 copies/mL). A negative result must be combined with clinical observations, patient history, and epidemiological information. The expected result is Negative.  Fact Sheet for Patients:  BloggerCourse.com  Fact Sheet for Healthcare Providers:  SeriousBroker.it  This test is no t yet approved or cleared by the [place] FDA and  has been authorized for detection and/or diagnosis of SARS-CoV-2 by FDA under an Emergency Use Authorization (EUA). This EUA will remain  in effect (meaning this test can be used) for the duration of the COVID-19 declaration under Section 564(b)(1) of the Act, 21 U.S.C.section 360bbb-3(b)(1), unless the authorization is terminated  or revoked sooner.       Influenza A by PCR NEGATIVE NEGATIVE Final   Influenza B by PCR NEGATIVE NEGATIVE Final    Comment: (NOTE) The Xpert Xpress SARS-CoV-2/FLU/RSV plus assay is intended as an aid in the diagnosis of influenza from Nasopharyngeal swab specimens and should not be used as a sole basis for treatment. Nasal washings and aspirates are unacceptable for Xpert Xpress SARS-CoV-2/FLU/RSV testing.  Fact Sheet for Patients: BloggerCourse.com  Fact Sheet for Healthcare  Providers: SeriousBroker.it  This test is not yet approved or cleared by the [place] FDA and has been authorized for detection and/or diagnosis of SARS-CoV-2 by FDA under an Emergency Use Authorization (EUA). This EUA will remain in effect (meaning this test can be used) for the duration of the COVID-19 declaration under Section 564(b)(1) of the Act, 21 U.S.C. section 360bbb-3(b)(1), unless the authorization is terminated or revoked.  Performed at [facility], [address], [place], [place] [address]   Urine culture     Status: Abnormal   Collection Time: 07/19/20 11:15 AM   Specimen: Urine, Random  Result Value Ref Range Status   Specimen Description URINE, RANDOM  Final   Special Requests NONE  Final   Culture (A)  Final    <10,000 COLONIES/mL INSIGNIFICANT GROWTH Performed at [facility], [address], [place], [place] [address]    Report Status 07/20/2020 FINAL  Final  Blood culture (routine x 2)     Status: None   Collection Time: 07/19/20 11:30 AM   Specimen: BLOOD LEFT WRIST  Result Value Ref Range Status   Specimen Description BLOOD LEFT WRIST  Final   Special Requests   Final    BOTTLES DRAWN  AEROBIC AND ANAEROBIC Blood Culture adequate volume   Culture   Final    NO GROWTH 5 DAYS Performed at [facility], [address], [place], [place] [address]    Report Status 07/24/2020 FINAL  Final     Labs: Basic Metabolic Panel: Recent Labs  Lab 07/19/20 2133 07/22/20 0513 07/23/20 0613 07/26/20 0604  NA 137 136 135 135  K 3.2* 3.6 4.1 3.4*  CL 107 104 104 102  CO2 22 24 22 22   GLUCOSE 102* 97 104* 117*  BUN <5* <5* <5* <5*  CREATININE 0.51 0.56 0.51 0.61  CALCIUM 8.0* 8.2* 8.3* 8.4*  MG 1.6*  --   --   --    Liver Function Tests: Recent Labs  Lab 07/22/20 0513 07/23/20 0613 07/26/20 0604  AST 136* 89* 146*  ALT 100* 85* 104*  ALKPHOS 50 54 59  BILITOT 1.1 1.3* 1.5*  PROT 6.0* 6.1* 6.6   ALBUMIN 2.9* 2.9* 2.9*   No results for input(s): LIPASE, AMYLASE in the last 168 hours. No results for input(s): AMMONIA in the last 168 hours. CBC: Recent Labs  Lab 07/20/20 0601 07/21/20 0549 07/22/20 0513 07/23/20 0613 07/25/20 0714  WBC 10.3 11.8* 14.2* 13.7* 11.0*  NEUTROABS  --   --  8.2* 8.2* 6.3  HGB 10.5* 9.5* 9.5* 9.5* 9.1*  HCT 28.2* 25.4* 25.7* 25.7* 24.3*  MCV 77.5* 76.5* 77.4* 77.9* 75.9*  PLT 297 246 253 283 355   Cardiac Enzymes: No results for input(s): CKTOTAL, CKMB, CKMBINDEX, TROPONINI in the last 168 hours. BNP: Invalid input(s): POCBNP CBG: No results for input(s): GLUCAP in the last 168 hours.  Time coordinating discharge: 50 minutes  Signed:  07/27/20  APRN, MSN, FNP-C Patient Care [facility] [address] [place], [place] [phone]  [facility] 07/26/2020, 1:54 PM

## 2020-07-26 NOTE — Progress Notes (Signed)
   07/26/20 0432  Assess: MEWS Score  Temp (!) 100.5 F (38.1 C)  BP (!) 108/56  Pulse Rate (!) 109  Resp 18  Level of Consciousness Alert  SpO2 95 %  O2 Device Room Air  Patient Activity (if Appropriate) In bed  Assess: MEWS Score  MEWS Temp 1  MEWS Systolic 0  MEWS Pulse 1  MEWS RR 0  MEWS LOC 0  MEWS Score 2  MEWS Score Color Yellow  Assess: if the MEWS score is Yellow or Red  Were vital signs taken at a resting state? Yes  Focused Assessment No change from prior assessment  Early Detection of Sepsis Score *See Row Information* Low  MEWS guidelines implemented *See Row Information* Yes  Treat  MEWS Interventions Administered prn meds/treatments  Pain Scale 0-10  Pain Score 5  Take Vital Signs  Increase Vital Sign Frequency  Yellow: Q 2hr X 2 then Q 4hr X 2, if remains yellow, continue Q 4hrs  Escalate  MEWS: Escalate Yellow: discuss with charge nurse/RN and consider discussing with provider and RRT  Notify: Charge Nurse/RN  Name of Charge Nurse/RN Notified [place], RN  Date Charge Nurse/RN Notified 07/26/20  Time Charge Nurse/RN Notified 0435  Notify: Provider  Provider Name/Title Katherina Right APP  Date Provider Notified 07/26/20  Time Provider Notified [phone]  Notification Type Page

## 2020-10-06 ENCOUNTER — Other Ambulatory Visit: Payer: Self-pay

## 2020-10-06 ENCOUNTER — Encounter (HOSPITAL_COMMUNITY): Payer: Self-pay | Admitting: Emergency Medicine

## 2020-10-06 ENCOUNTER — Emergency Department (HOSPITAL_COMMUNITY): Payer: Medicaid Other

## 2020-10-06 ENCOUNTER — Inpatient Hospital Stay (HOSPITAL_COMMUNITY)
Admission: EM | Admit: 2020-10-06 | Discharge: 2020-10-11 | DRG: 812 | Disposition: A | Discharge status: Home / Self Care | Payer: Medicaid Other | Attending: Internal Medicine | Admitting: Internal Medicine

## 2020-10-06 DIAGNOSIS — D72829 Elevated white blood cell count, unspecified: Secondary | ICD-10-CM | POA: Diagnosis present

## 2020-10-06 DIAGNOSIS — Z79899 Other long term (current) drug therapy: Secondary | ICD-10-CM

## 2020-10-06 DIAGNOSIS — R1010 Upper abdominal pain, unspecified: Secondary | ICD-10-CM

## 2020-10-06 DIAGNOSIS — D5700 Hb-SS disease with crisis, unspecified: Principal | ICD-10-CM | POA: Diagnosis present

## 2020-10-06 DIAGNOSIS — J4520 Mild intermittent asthma, uncomplicated: Secondary | ICD-10-CM | POA: Diagnosis present

## 2020-10-06 DIAGNOSIS — R6510 Systemic inflammatory response syndrome (SIRS) of non-infectious origin without acute organ dysfunction: Secondary | ICD-10-CM | POA: Diagnosis present

## 2020-10-06 DIAGNOSIS — Z20822 Contact with and (suspected) exposure to covid-19: Secondary | ICD-10-CM | POA: Diagnosis present

## 2020-10-06 DIAGNOSIS — Z832 Family history of diseases of the blood and blood-forming organs and certain disorders involving the immune mechanism: Secondary | ICD-10-CM

## 2020-10-06 DIAGNOSIS — Z8616 Personal history of COVID-19: Secondary | ICD-10-CM

## 2020-10-06 DIAGNOSIS — D72823 Leukemoid reaction: Secondary | ICD-10-CM | POA: Diagnosis not present

## 2020-10-06 DIAGNOSIS — R0789 Other chest pain: Secondary | ICD-10-CM | POA: Diagnosis present

## 2020-10-06 DIAGNOSIS — R7401 Elevation of levels of liver transaminase levels: Secondary | ICD-10-CM | POA: Diagnosis present

## 2020-10-06 LAB — TROPONIN I (HIGH SENSITIVITY)
Troponin I (High Sensitivity): 4 ng/L (ref <18)
Troponin I (High Sensitivity): 3 ng/L (ref <18)

## 2020-10-06 LAB — COMPREHENSIVE METABOLIC PANEL
ALT: 69 U/L — ABNORMAL HIGH (ref 0–44)
AST: 120 U/L — ABNORMAL HIGH (ref 15–41)
Albumin: 4.4 g/dL (ref 3.5–5.0)
Alkaline Phosphatase: 67 U/L (ref 38–126)
Anion gap: 9 (ref 5–15)
BUN: 5 mg/dL — ABNORMAL LOW (ref 6–20)
CO2: 24 mmol/L (ref 22–32)
Calcium: 9.4 mg/dL (ref 8.9–10.3)
Chloride: 104 mmol/L (ref 98–111)
Creatinine, Ser: 0.69 mg/dL (ref 0.44–1.00)
GFR, Estimated: >60 mL/min (ref >60)
Glucose, Bld: 82 mg/dL (ref 70–99)
Potassium: 4.5 mmol/L (ref 3.5–5.1)
Sodium: 137 mmol/L (ref 135–145)
Total Bilirubin: 1.3 mg/dL — ABNORMAL HIGH (ref 0.3–1.2)
Total Protein: 8.4 g/dL — ABNORMAL HIGH (ref 6.5–8.1)

## 2020-10-06 LAB — URINALYSIS, ROUTINE W REFLEX MICROSCOPIC
Bilirubin Urine: NEGATIVE
Glucose, UA: NEGATIVE mg/dL
Hgb urine dipstick: NEGATIVE
Ketones, ur: 5 mg/dL — AB
Leukocytes,Ua: NEGATIVE
Nitrite: NEGATIVE
Protein, ur: NEGATIVE mg/dL
Specific Gravity, Urine: 1.011 (ref 1.005–1.030)
pH: 7 (ref 5.0–8.0)

## 2020-10-06 LAB — RETICULOCYTES
Immature Retic Fract: 54 % — ABNORMAL HIGH (ref 2.3–15.9)
RBC.: 3.89 MIL/uL (ref 3.87–5.11)
Retic Count, Absolute: 140 10*3/uL (ref 19.0–186.0)
Retic Ct Pct: 3.6 % — ABNORMAL HIGH (ref 0.4–3.1)

## 2020-10-06 LAB — I-STAT BETA HCG BLOOD, ED (MC, WL, AP ONLY): I-stat hCG, quantitative: <5 m[IU]/mL (ref <5)

## 2020-10-06 LAB — LIPASE, BLOOD: Lipase: 25 U/L (ref 11–51)

## 2020-10-06 MED ORDER — MORPHINE SULFATE (PF) 4 MG/ML IV SOLN
4.0000 mg | Freq: Once | INTRAVENOUS | Status: AC
  Administered 2020-10-06: 4 mg via INTRAVENOUS
  Filled 2020-10-06: qty 1

## 2020-10-06 MED ORDER — ONDANSETRON HCL 4 MG/2ML IJ SOLN
4.0000 mg | Freq: Once | INTRAMUSCULAR | Status: AC
  Administered 2020-10-06: 4 mg via INTRAVENOUS
  Filled 2020-10-06: qty 2

## 2020-10-06 MED ORDER — SODIUM CHLORIDE 0.45 % IV SOLN: Freq: Once | INTRAVENOUS | Status: AC

## 2020-10-06 MED ORDER — IOHEXOL 350 MG/ML SOLN
100.0000 mL | Freq: Once | INTRAVENOUS | Status: AC | PRN
  Administered 2020-10-06: 100 mL via INTRAVENOUS

## 2020-10-06 NOTE — ED Provider Notes (Incomplete)
[facility]-EMERGENCY DEPT Provider Note   CSN: [id number] Arrival date & time: 10/06/20  1925     History Chief Complaint  Patient presents with  . Abdominal Pain  . Chest Pain    Shelby Coffey is a 20 y.o. female past medical history of sickle cell anemia, asthma, acute chest syndrome, presenting emergency department with complaint of abdominal pain and chest pain that began this morning.  She states pain initially again to the upper abdomen, located across both sides of her abdomen.  Pain is a throbbing pain similar to her sickle cell pain, her location is very atypical.  She states she treated her symptoms throughout the day, however symptoms worsen.  She now has accompanying substernal pain.  Pain appears to be coming in waves.  She is feeling some shortness of breath.  She has any nausea vomiting, diarrhea, constipation, urinary symptoms.  No fevers or cough.  Does not feel like acute chest, normally she has cough fever with this. Reports recent COVID-19 illness, states she had a positive home rapid test in January.  Prior to that she was admitted the end of December for sickle cell crisis and developed pneumonia.  The history is provided by the patient and medical records.       Past Medical History:  Diagnosis Date  . Asthma   . Scoliosis   . Sickle cell anemia [facility])     Patient Active Problem List   Diagnosis Date Noted  . Sickle cell pain crisis (HCC) 07/19/2020  . Transaminitis 07/19/2020  . Hypokalemia 07/19/2020  . History of depression 07/19/2020  . Severe recurrent major depression without psychotic features (HCC) 09/04/2017  . Acute chest syndrome due to sickle-cell disease (HCC) 02/08/2017  . Sickle cell crisis (HCC) 02/06/2017  . Febrile illness 02/06/2017  . Leukocytosis 02/06/2017  . Scoliosis 07/07/2013    History reviewed. No pertinent surgical history.   OB History    Gravida  0   Para  0   Term  0   Preterm  0   AB  0    Living  0     SAB  0   IAB  0   Ectopic  0   Multiple  0   Live Births  0           Family History  Problem Relation Age of Onset  . Breast cancer Mother   . Sickle cell trait Sister   . Diabetes type II Maternal Grandmother     Social History   Tobacco Use  . Smoking status: Never Smoker  . Smokeless tobacco: Never Used  Substance Use Topics  . Alcohol use: No  . Drug use: Yes    Types: Marijuana    Comment: less than once a week    Home Medications Prior to Admission medications   Medication Sig Start Date End Date Taking? Authorizing Provider  acetaminophen (TYLENOL) 650 MG CR tablet Take 650 mg by mouth every 8 (eight) hours as needed for pain.   Yes [provider]  albuterol (PROVENTIL HFA;VENTOLIN HFA) 108 (90 Base) MCG/ACT inhaler Inhale 2 puffs into the lungs every 6 (six) hours as needed for wheezing or shortness of breath.   Yes [provider]  amphetamine-dextroamphetamine (ADDERALL) 15 MG tablet Take 15 mg by mouth daily.   Yes [provider]  CHLOROPHYLL PO Take 1 capsule by mouth daily.   Yes [provider]  DULoxetine (CYMBALTA) 30 MG capsule Take 30  mg by mouth daily.   Yes [provider]  ELDERBERRY PO Take 1 capsule by mouth daily.   Yes [provider]  etonogestrel (NEXPLANON) 68 MG IMPL implant 68 mg by Subdermal route continuous.   Yes [provider]  OVER THE COUNTER MEDICATION Take 1 capsule by mouth daily. Sea moss   Yes [provider]  oxyCODONE (OXY IR/ROXICODONE) 5 MG immediate release tablet Take 1 tablet (5 mg total) by mouth every 4 (four) hours as needed for severe pain. 07/26/20  Yes Massie Maroon, FNP  hydrOXYzine (ATARAX/VISTARIL) 25 MG tablet Take 25 mg by mouth daily as needed for anxiety. Patient not taking: Reported on 10/06/2020    [provider]  ibuprofen (ADVIL) 800 MG tablet Take 1 tablet (800 mg total) by mouth 3 (three) times  daily. Patient not taking: No sig reported 07/26/20   Massie Maroon, FNP    Allergies    Patient has no known allergies.  Review of Systems   Review of Systems  Respiratory: Positive for shortness of breath.   Cardiovascular: Positive for chest pain.  Gastrointestinal: Positive for abdominal pain.  All other systems reviewed and are negative.   Physical Exam Updated Vital Signs BP 126/69   Pulse (!) 101   Temp 99.3 F (37.4 C) (Oral)   Resp (!) 23   Ht 5\' 7"  (1.702 m)   Wt 65.8 kg   LMP 09/09/2020   SpO2 99%   BMI 22.71 kg/m   Physical Exam Vitals and nursing note reviewed.  Constitutional:      Appearance: She is well-developed.     Comments: Tearful and appears very uncomfortable.  HENT:     Head: Normocephalic and atraumatic.  Eyes:     Conjunctiva/sclera: Conjunctivae normal.  Cardiovascular:     Rate and Rhythm: Regular rhythm. Tachycardia present.  Pulmonary:     Effort: Pulmonary effort is normal. No respiratory distress.     Breath sounds: Normal breath sounds.  Abdominal:     General: Abdomen is flat. Bowel sounds are normal.     Palpations: Abdomen is soft.     Tenderness: There is abdominal tenderness (Generalized though worse in the upper quadrants).  Musculoskeletal:     Right lower leg: No edema.     Left lower leg: No edema.  Skin:    General: Skin is warm.  Neurological:     Mental Status: She is alert.  Psychiatric:        Behavior: Behavior normal.     ED Results / Procedures / Treatments   Labs (all labs ordered are listed, but only abnormal results are displayed) Labs Reviewed  URINALYSIS, ROUTINE W REFLEX MICROSCOPIC - Abnormal; Notable for the following components:      Result Value   Ketones, ur 5 (*)    All other components within normal limits  COMPREHENSIVE METABOLIC PANEL - Abnormal; Notable for the following components:   BUN 5 (*)    Total Protein 8.4 (*)    AST 120 (*)    ALT 69 (*)    Total Bilirubin 1.3 (*)     All other components within normal limits  RETICULOCYTES - Abnormal; Notable for the following components:   Retic Ct Pct 3.6 (*)    Immature Retic Fract 54.0 (*)    All other components within normal limits  CBC WITH DIFFERENTIAL/PLATELET - Abnormal; Notable for the following components:   WBC 25.1 (*)    Hemoglobin 11.2 (*)  HCT 30.3 (*)    MCV 76.5 (*)    MCHC 37.0 (*)    RDW 17.5 (*)    nRBC 1.3 (*)    nRBC 3 (*)    All other components within normal limits  CULTURE, BLOOD (ROUTINE X 2)  CULTURE, BLOOD (ROUTINE X 2)  LIPASE, BLOOD  I-STAT BETA HCG BLOOD, ED ([facility], [facility], [facility] ONLY)  TROPONIN I (HIGH SENSITIVITY)  TROPONIN I (HIGH SENSITIVITY)    EKG EKG Interpretation  Date/Time:  Sunday October 06 2020 19:33:04 EDT Ventricular Rate:  119 PR Interval:    QRS Duration: 79 QT Interval:  363 QTC Calculation: 511 R Axis:   68 Text Interpretation: Sinus tachycardia Ventricular premature complex Confirmed by Kristine RoyalMessick, Peter [phone]([id number]) on 10/06/2020 11:20:59 PM   Radiology DG Chest 2 View  Result Date: 10/06/2020 CLINICAL DATA:  Central chest pain, abdominal pain EXAM: CHEST - 2 VIEW COMPARISON:  07/20/2020 FINDINGS: Frontal and lateral views of the chest demonstrate a stable cardiac silhouette. No acute airspace disease, effusion, or pneumothorax. Right convex thoracic scoliosis again noted. No acute bony abnormality. IMPRESSION: 1. No acute intrathoracic process. Electronically Signed   By: Sharlet SalinaMichael  Brown M.D.   On: 10/06/2020 19:59   CT Angio Chest PE W/Cm &/Or Wo Cm  Result Date: 10/06/2020 CLINICAL DATA:  Nonlocalized acute abdominal pain, chest pain. Sickle cell. EXAM: CT ANGIOGRAPHY CHEST CT ABDOMEN AND PELVIS WITH CONTRAST TECHNIQUE: Multidetector CT imaging of the chest was performed using the standard protocol during bolus administration of intravenous contrast. Multiplanar CT image reconstructions and MIPs were obtained to evaluate the vascular anatomy. Multidetector CT  imaging of the abdomen and pelvis was performed using the standard protocol during bolus administration of intravenous contrast. CONTRAST:  100mL OMNIPAQUE IOHEXOL 350 MG/ML SOLN COMPARISON:  Chest x-ray 02/08/2017, 06/28/2018, 07/20/2020. CT angiography chest 07/22/2020 FINDINGS: CTA CHEST FINDINGS Cardiovascular: Satisfactory opacification of the pulmonary arteries to the segmental level. No evidence of pulmonary embolism. Normal heart size. No pericardial effusion. Mediastinum/Nodes: No enlarged mediastinal, hilar, or axillary lymph nodes. Thyroid gland, trachea, and esophagus demonstrate no significant findings. Lungs/Pleura: Bilateral lower lobe subsegmental atelectasis. Trace linear atelectasis within the lingula. No focal consolidation. No pulmonary nodule or mass. No pleural effusion or pneumothorax. Musculoskeletal: No chest wall abnormality. No acute or significant osseous findings. Dextroscoliosis of thoracic spine appears similar to prior. Review of the MIP images confirms the above findings. CT ABDOMEN and PELVIS FINDINGS Hepatobiliary: No focal liver abnormality is seen. No gallstones, gallbladder wall thickening, or biliary dilatation. Pancreas: Unremarkable. No pancreatic ductal dilatation or surrounding inflammatory changes. Spleen: Normal in size without focal abnormality. Adrenals/Urinary Tract: Adrenal glands are unremarkable. Kidneys are normal, without renal calculi, focal lesion, or hydronephrosis. Bladder is unremarkable. Stomach/Bowel: Stomach is within normal limits. Appendix appears normal. No evidence of bowel wall thickening, distention, or inflammatory changes. Vascular/Lymphatic: No significant vascular findings are present. No enlarged abdominal or pelvic lymph nodes. Reproductive: Incidentally noted 2.8 cm cystic lesion within the left ovary. No follow-up imaging recommended. Note: This recommendation does not apply to premenarchal patients and to those with increased risk (genetic,  family history, elevated tumor markers or other high-risk factors) of ovarian cancer. Reference: JACR 2020 Feb; 17(2):248-254. Otherwise the uterus and bilateral adnexa are unremarkable. Other: No intraperitoneal free fluid. No intraperitoneal free gas. No organized fluid collection. Musculoskeletal: No acute or significant osseous findings. Review of the MIP images confirms the above findings. IMPRESSION: 1. No central or segmental pulmonary embolus. 2. No acute intrathoracic  abnormality. 3. No acute intra-abdominal or intrapelvic abnormality. 4. Dextroscoliosis of the thoracic spine. Electronically Signed   By: Tish Frederickson M.D.   On: 10/06/2020 22:55   CT Abdomen Pelvis W Contrast  Result Date: 10/06/2020 CLINICAL DATA:  Nonlocalized acute abdominal pain, chest pain. Sickle cell. EXAM: CT ANGIOGRAPHY CHEST CT ABDOMEN AND PELVIS WITH CONTRAST TECHNIQUE: Multidetector CT imaging of the chest was performed using the standard protocol during bolus administration of intravenous contrast. Multiplanar CT image reconstructions and MIPs were obtained to evaluate the vascular anatomy. Multidetector CT imaging of the abdomen and pelvis was performed using the standard protocol during bolus administration of intravenous contrast. CONTRAST:  OMNIPAQUE IOHEXOL 350 MG/ML SOLN COMPARISON:  Chest x-ray 02/08/2017, 06/28/2018, 07/20/2020. CT angiography chest 07/22/2020 FINDINGS: CTA CHEST FINDINGS Cardiovascular: Satisfactory opacification of the pulmonary arteries to the segmental level. No evidence of pulmonary embolism. Normal heart size. No pericardial effusion. Mediastinum/Nodes: No enlarged mediastinal, hilar, or axillary lymph nodes. Thyroid gland, trachea, and esophagus demonstrate no significant findings. Lungs/Pleura: Bilateral lower lobe subsegmental atelectasis. Trace linear atelectasis within the lingula. No focal consolidation. No pulmonary nodule or mass. No pleural effusion or pneumothorax.  Musculoskeletal: No chest wall abnormality. No acute or significant osseous findings. Dextroscoliosis of thoracic spine appears similar to prior. Review of the MIP images confirms the above findings. CT ABDOMEN and PELVIS FINDINGS Hepatobiliary: No focal liver abnormality is seen. No gallstones, gallbladder wall thickening, or biliary dilatation. Pancreas: Unremarkable. No pancreatic ductal dilatation or surrounding inflammatory changes. Spleen: Normal in size without focal abnormality. Adrenals/Urinary Tract: Adrenal glands are unremarkable. Kidneys are normal, without renal calculi, focal lesion, or hydronephrosis. Bladder is unremarkable. Stomach/Bowel: Stomach is within normal limits. Appendix appears normal. No evidence of bowel wall thickening, distention, or inflammatory changes. Vascular/Lymphatic: No significant vascular findings are present. No enlarged abdominal or pelvic lymph nodes. Reproductive: Incidentally noted 2.8 cm cystic lesion within the left ovary. No follow-up imaging recommended. Note: This recommendation does not apply to premenarchal patients and to those with increased risk (genetic, family history, elevated tumor markers or other high-risk factors) of ovarian cancer. Reference: JACR 2020 Feb; 17(2):248-254. Otherwise the uterus and bilateral adnexa are unremarkable. Other: No intraperitoneal free fluid. No intraperitoneal free gas. No organized fluid collection. Musculoskeletal: No acute or significant osseous findings. Review of the MIP images confirms the above findings. IMPRESSION: 1. No central or segmental pulmonary embolus. 2. No acute intrathoracic abnormality. 3. No acute intra-abdominal or intrapelvic abnormality. 4. Dextroscoliosis of the thoracic spine. Electronically Signed   By: Tish Frederickson M.D.   On: 10/06/2020 22:55    Procedures Procedures {Remember to document critical care time when appropriate:1}  Medications Ordered in ED Medications  morphine 4 MG/ML  injection 4 mg (4 mg Intravenous Given 10/06/20 2112)  ondansetron (ZOFRAN) injection 4 mg (4 mg Intravenous Given 10/06/20 2112)  iohexol (OMNIPAQUE) 350 MG/ML injection 100 mL (100 mLs Intravenous Contrast Given 10/06/20 2218)  0.45 % sodium chloride infusion ( Intravenous New Bag/Given 10/06/20 2330)  morphine 4 MG/ML injection 4 mg (4 mg Intravenous Given 10/06/20 2332)    ED Course  I have reviewed the triage vital signs and the nursing notes.  Pertinent labs & imaging results that were available during my care of the patient were reviewed by me and considered in my medical decision making (see chart for details).    MDM Rules/Calculators/A&P  Patient is a 76 old female with history of sickle cell anemia, presenting with abdominal pain and chest pain that began today.  On evaluation she is quite uncomfortable, tearful.  Reports substernal chest pain and bilateral upper abdominal pain.  Symptoms do not feel like typical sickle cell crisis.  Her pain is usually in her arms and legs.  She is not having fevers at home or infectious symptoms, no cough.  No nausea/vomiting/diarrhea/constipation.  No urinary symptoms.  Her pain is treated.  Laboratory work-up ordered including CBC, CMP, reticulocyte, lipase, pregnancy test, troponins.  Additionally, there is concern for PE versus other acute intra-abdominal pathology.  Imaging modality discussed with attending physician Dr. Rodena Medin.  Will obtain CTA of the chest to rule out PE, as well as imaging of the abdomen pelvis.  CTA is negative for PE or other acute findings in the chest, scan of the abdomen is also negative.  Incidental finding of ovarian cyst as discussed with patient for outpatient GYN follow-up.  Her white blood cell count is elevated to 25,000, hemoglobin appears stable at 11.2.  Metabolic panel with mild transaminitis though this appears to be patient's baseline and unchanged from this.  Lipase within normal limits.   Negative pregnancy.  Troponins are flat and normal. She is given IVF infusion. Pain medication re-dosed.  Unsure of etiology of patient's symptoms though given history of sickle cell anemia, she is at increased risk for morbidities.  Believe she would benefit from observation admission and further assessment. Patient is in agreement. Dr. Toniann Fail accepting admission. Final Clinical Impression(s) / ED Diagnoses Final diagnoses:  None    Rx / DC Orders ED Discharge Orders    None

## 2020-10-06 NOTE — ED Provider Notes (Signed)
[facility]-EMERGENCY DEPT Provider Note   CSN: [id number] Arrival date & time: 10/06/20  1925     History Chief Complaint  Patient presents with  . Abdominal Pain  . Chest Pain    Shelby Coffey is a 20 y.o. female past medical history of sickle cell anemia, asthma, acute chest syndrome, presenting emergency department with complaint of abdominal pain and chest pain that began this morning.  She states pain initially again to the upper abdomen, located across both sides of her abdomen.  Pain is a throbbing pain similar to her sickle cell pain, her location is very atypical.  She states she treated her symptoms throughout the day, however symptoms worsen.  She now has accompanying substernal pain.  Pain appears to be coming in waves.  She is feeling some shortness of breath.  She has any nausea vomiting, diarrhea, constipation, urinary symptoms.  No fevers or cough.  Does not feel like acute chest, normally she has cough fever with this. Reports recent COVID-19 illness, states she had a positive home rapid test in January.  Prior to that she was admitted the end of December for sickle cell crisis and developed pneumonia.  The history is provided by the patient and medical records.       Past Medical History:  Diagnosis Date  . Asthma   . Scoliosis   . Sickle cell anemia [facility])     Patient Active Problem List   Diagnosis Date Noted  . Sickle cell pain crisis (HCC) 07/19/2020  . Transaminitis 07/19/2020  . Hypokalemia 07/19/2020  . History of depression 07/19/2020  . Severe recurrent major depression without psychotic features (HCC) 09/04/2017  . Acute chest syndrome due to sickle-cell disease (HCC) 02/08/2017  . Sickle cell crisis (HCC) 02/06/2017  . Febrile illness 02/06/2017  . Leukocytosis 02/06/2017  . Scoliosis 07/07/2013    History reviewed. No pertinent surgical history.   OB History    Gravida  0   Para  0   Term  0   Preterm  0   AB  0    Living  0     SAB  0   IAB  0   Ectopic  0   Multiple  0   Live Births  0           Family History  Problem Relation Age of Onset  . Breast cancer Mother   . Sickle cell trait Sister   . Diabetes type II Maternal Grandmother     Social History   Tobacco Use  . Smoking status: Never Smoker  . Smokeless tobacco: Never Used  Substance Use Topics  . Alcohol use: No  . Drug use: Yes    Types: Marijuana    Comment: less than once a week    Home Medications Prior to Admission medications   Medication Sig Start Date End Date Taking? Authorizing Provider  acetaminophen (TYLENOL) 650 MG CR tablet Take 650 mg by mouth every 8 (eight) hours as needed for pain.   Yes [provider]  albuterol (PROVENTIL HFA;VENTOLIN HFA) 108 (90 Base) MCG/ACT inhaler Inhale 2 puffs into the lungs every 6 (six) hours as needed for wheezing or shortness of breath.   Yes [provider]  amphetamine-dextroamphetamine (ADDERALL) 15 MG tablet Take 15 mg by mouth daily.   Yes [provider]  CHLOROPHYLL PO Take 1 capsule by mouth daily.   Yes [provider]  DULoxetine (CYMBALTA) 30 MG capsule Take 30  mg by mouth daily.   Yes [provider]  ELDERBERRY PO Take 1 capsule by mouth daily.   Yes [provider]  etonogestrel (NEXPLANON) 68 MG IMPL implant 68 mg by Subdermal route continuous.   Yes [provider]  OVER THE COUNTER MEDICATION Take 1 capsule by mouth daily. Sea moss   Yes [provider]  oxyCODONE (OXY IR/ROXICODONE) 5 MG immediate release tablet Take 1 tablet (5 mg total) by mouth every 4 (four) hours as needed for severe pain. 07/26/20  Yes Massie Maroon, FNP  hydrOXYzine (ATARAX/VISTARIL) 25 MG tablet Take 25 mg by mouth daily as needed for anxiety. Patient not taking: Reported on 10/06/2020    [provider]  ibuprofen (ADVIL) 800 MG tablet Take 1 tablet (800 mg total) by mouth 3 (three) times  daily. Patient not taking: No sig reported 07/26/20   Massie Maroon, FNP    Allergies    Patient has no known allergies.  Review of Systems   Review of Systems  Respiratory: Positive for shortness of breath.   Cardiovascular: Positive for chest pain.  Gastrointestinal: Positive for abdominal pain.  All other systems reviewed and are negative.   Physical Exam Updated Vital Signs BP 126/69   Pulse (!) 101   Temp 99.3 F (37.4 C) (Oral)   Resp (!) 23   Ht 5\' 7"  (1.702 m)   Wt 65.8 kg   LMP 09/09/2020   SpO2 99%   BMI 22.71 kg/m   Physical Exam Vitals and nursing note reviewed.  Constitutional:      Appearance: She is well-developed.     Comments: Tearful and appears very uncomfortable.  HENT:     Head: Normocephalic and atraumatic.  Eyes:     Conjunctiva/sclera: Conjunctivae normal.  Cardiovascular:     Rate and Rhythm: Regular rhythm. Tachycardia present.  Pulmonary:     Effort: Pulmonary effort is normal. No respiratory distress.     Breath sounds: Normal breath sounds.  Abdominal:     General: Abdomen is flat. Bowel sounds are normal.     Palpations: Abdomen is soft.     Tenderness: There is abdominal tenderness (Generalized though worse in the upper quadrants).  Musculoskeletal:     Right lower leg: No edema.     Left lower leg: No edema.  Skin:    General: Skin is warm.  Neurological:     Mental Status: She is alert.  Psychiatric:        Behavior: Behavior normal.     ED Results / Procedures / Treatments   Labs (all labs ordered are listed, but only abnormal results are displayed) Labs Reviewed  URINALYSIS, ROUTINE W REFLEX MICROSCOPIC - Abnormal; Notable for the following components:      Result Value   Ketones, ur 5 (*)    All other components within normal limits  COMPREHENSIVE METABOLIC PANEL - Abnormal; Notable for the following components:   BUN 5 (*)    Total Protein 8.4 (*)    AST 120 (*)    ALT 69 (*)    Total Bilirubin 1.3 (*)     All other components within normal limits  RETICULOCYTES - Abnormal; Notable for the following components:   Retic Ct Pct 3.6 (*)    Immature Retic Fract 54.0 (*)    All other components within normal limits  CBC WITH DIFFERENTIAL/PLATELET - Abnormal; Notable for the following components:   WBC 25.1 (*)    Hemoglobin 11.2 (*)  HCT 30.3 (*)    MCV 76.5 (*)    MCHC 37.0 (*)    RDW 17.5 (*)    nRBC 1.3 (*)    nRBC 3 (*)    All other components within normal limits  CULTURE, BLOOD (ROUTINE X 2)  CULTURE, BLOOD (ROUTINE X 2)  RESP PANEL BY RT-PCR (FLU A&B, COVID) ARPGX2  LIPASE, BLOOD  I-STAT BETA HCG BLOOD, ED ([facility], [facility], [facility] ONLY)  TROPONIN I (HIGH SENSITIVITY)  TROPONIN I (HIGH SENSITIVITY)    EKG EKG Interpretation  Date/Time:  Sunday October 06 2020 19:33:04 EDT Ventricular Rate:  119 PR Interval:    QRS Duration: 79 QT Interval:  363 QTC Calculation: 511 R Axis:   68 Text Interpretation: Sinus tachycardia Ventricular premature complex Confirmed by Kristine Royal [phone]) on 10/06/2020 11:20:59 PM   Radiology DG Chest 2 View  Result Date: 10/06/2020 CLINICAL DATA:  Central chest pain, abdominal pain EXAM: CHEST - 2 VIEW COMPARISON:  07/20/2020 FINDINGS: Frontal and lateral views of the chest demonstrate a stable cardiac silhouette. No acute airspace disease, effusion, or pneumothorax. Right convex thoracic scoliosis again noted. No acute bony abnormality. IMPRESSION: 1. No acute intrathoracic process. Electronically Signed   By: Sharlet Salina M.D.   On: 10/06/2020 19:59   CT Angio Chest PE W/Cm &/Or Wo Cm  Result Date: 10/06/2020 CLINICAL DATA:  Nonlocalized acute abdominal pain, chest pain. Sickle cell. EXAM: CT ANGIOGRAPHY CHEST CT ABDOMEN AND PELVIS WITH CONTRAST TECHNIQUE: Multidetector CT imaging of the chest was performed using the standard protocol during bolus administration of intravenous contrast. Multiplanar CT image reconstructions and MIPs were obtained to  evaluate the vascular anatomy. Multidetector CT imaging of the abdomen and pelvis was performed using the standard protocol during bolus administration of intravenous contrast. CONTRAST:  OMNIPAQUE IOHEXOL 350 MG/ML SOLN COMPARISON:  Chest x-ray 02/08/2017, 06/28/2018, 07/20/2020. CT angiography chest 07/22/2020 FINDINGS: CTA CHEST FINDINGS Cardiovascular: Satisfactory opacification of the pulmonary arteries to the segmental level. No evidence of pulmonary embolism. Normal heart size. No pericardial effusion. Mediastinum/Nodes: No enlarged mediastinal, hilar, or axillary lymph nodes. Thyroid gland, trachea, and esophagus demonstrate no significant findings. Lungs/Pleura: Bilateral lower lobe subsegmental atelectasis. Trace linear atelectasis within the lingula. No focal consolidation. No pulmonary nodule or mass. No pleural effusion or pneumothorax. Musculoskeletal: No chest wall abnormality. No acute or significant osseous findings. Dextroscoliosis of thoracic spine appears similar to prior. Review of the MIP images confirms the above findings. CT ABDOMEN and PELVIS FINDINGS Hepatobiliary: No focal liver abnormality is seen. No gallstones, gallbladder wall thickening, or biliary dilatation. Pancreas: Unremarkable. No pancreatic ductal dilatation or surrounding inflammatory changes. Spleen: Normal in size without focal abnormality. Adrenals/Urinary Tract: Adrenal glands are unremarkable. Kidneys are normal, without renal calculi, focal lesion, or hydronephrosis. Bladder is unremarkable. Stomach/Bowel: Stomach is within normal limits. Appendix appears normal. No evidence of bowel wall thickening, distention, or inflammatory changes. Vascular/Lymphatic: No significant vascular findings are present. No enlarged abdominal or pelvic lymph nodes. Reproductive: Incidentally noted 2.8 cm cystic lesion within the left ovary. No follow-up imaging recommended. Note: This recommendation does not apply to premenarchal  patients and to those with increased risk (genetic, family history, elevated tumor markers or other high-risk factors) of ovarian cancer. Reference: JACR 2020 Feb; 17(2):248-254. Otherwise the uterus and bilateral adnexa are unremarkable. Other: No intraperitoneal free fluid. No intraperitoneal free gas. No organized fluid collection. Musculoskeletal: No acute or significant osseous findings. Review of the MIP images confirms the above findings. IMPRESSION: 1. No  central or segmental pulmonary embolus. 2. No acute intrathoracic abnormality. 3. No acute intra-abdominal or intrapelvic abnormality. 4. Dextroscoliosis of the thoracic spine. Electronically Signed   By: Tish Frederickson M.D.   On: 10/06/2020 22:55   CT Abdomen Pelvis W Contrast  Result Date: 10/06/2020 CLINICAL DATA:  Nonlocalized acute abdominal pain, chest pain. Sickle cell. EXAM: CT ANGIOGRAPHY CHEST CT ABDOMEN AND PELVIS WITH CONTRAST TECHNIQUE: Multidetector CT imaging of the chest was performed using the standard protocol during bolus administration of intravenous contrast. Multiplanar CT image reconstructions and MIPs were obtained to evaluate the vascular anatomy. Multidetector CT imaging of the abdomen and pelvis was performed using the standard protocol during bolus administration of intravenous contrast. CONTRAST:  OMNIPAQUE IOHEXOL 350 MG/ML SOLN COMPARISON:  Chest x-ray 02/08/2017, 06/28/2018, 07/20/2020. CT angiography chest 07/22/2020 FINDINGS: CTA CHEST FINDINGS Cardiovascular: Satisfactory opacification of the pulmonary arteries to the segmental level. No evidence of pulmonary embolism. Normal heart size. No pericardial effusion. Mediastinum/Nodes: No enlarged mediastinal, hilar, or axillary lymph nodes. Thyroid gland, trachea, and esophagus demonstrate no significant findings. Lungs/Pleura: Bilateral lower lobe subsegmental atelectasis. Trace linear atelectasis within the lingula. No focal consolidation. No pulmonary nodule or  mass. No pleural effusion or pneumothorax. Musculoskeletal: No chest wall abnormality. No acute or significant osseous findings. Dextroscoliosis of thoracic spine appears similar to prior. Review of the MIP images confirms the above findings. CT ABDOMEN and PELVIS FINDINGS Hepatobiliary: No focal liver abnormality is seen. No gallstones, gallbladder wall thickening, or biliary dilatation. Pancreas: Unremarkable. No pancreatic ductal dilatation or surrounding inflammatory changes. Spleen: Normal in size without focal abnormality. Adrenals/Urinary Tract: Adrenal glands are unremarkable. Kidneys are normal, without renal calculi, focal lesion, or hydronephrosis. Bladder is unremarkable. Stomach/Bowel: Stomach is within normal limits. Appendix appears normal. No evidence of bowel wall thickening, distention, or inflammatory changes. Vascular/Lymphatic: No significant vascular findings are present. No enlarged abdominal or pelvic lymph nodes. Reproductive: Incidentally noted 2.8 cm cystic lesion within the left ovary. No follow-up imaging recommended. Note: This recommendation does not apply to premenarchal patients and to those with increased risk (genetic, family history, elevated tumor markers or other high-risk factors) of ovarian cancer. Reference: JACR 2020 Feb; 17(2):248-254. Otherwise the uterus and bilateral adnexa are unremarkable. Other: No intraperitoneal free fluid. No intraperitoneal free gas. No organized fluid collection. Musculoskeletal: No acute or significant osseous findings. Review of the MIP images confirms the above findings. IMPRESSION: 1. No central or segmental pulmonary embolus. 2. No acute intrathoracic abnormality. 3. No acute intra-abdominal or intrapelvic abnormality. 4. Dextroscoliosis of the thoracic spine. Electronically Signed   By: Tish Frederickson M.D.   On: 10/06/2020 22:55    Procedures Procedures   Medications Ordered in ED Medications  morphine 4 MG/ML injection 4 mg (4 mg  Intravenous Given 10/06/20 2112)  ondansetron (ZOFRAN) injection 4 mg (4 mg Intravenous Given 10/06/20 2112)  iohexol (OMNIPAQUE) 350 MG/ML injection 100 mL (100 mLs Intravenous Contrast Given 10/06/20 2218)  0.45 % sodium chloride infusion ( Intravenous New Bag/Given 10/06/20 2330)  morphine 4 MG/ML injection 4 mg (4 mg Intravenous Given 10/06/20 2332)    ED Course  I have reviewed the triage vital signs and the nursing notes.  Pertinent labs & imaging results that were available during my care of the patient were reviewed by me and considered in my medical decision making (see chart for details).    MDM Rules/Calculators/A&P  Patient is a 29 old female with history of sickle cell anemia, presenting with abdominal pain and chest pain that began today.  On evaluation she is quite uncomfortable, tearful.  Reports substernal chest pain and bilateral upper abdominal pain.  Symptoms do not feel like typical sickle cell crisis.  Her pain is usually in her arms and legs.  She is not having fevers at home or infectious symptoms, no cough.  No nausea/vomiting/diarrhea/constipation.  No urinary symptoms.  Her pain is treated.  Laboratory work-up ordered including CBC, CMP, reticulocyte, lipase, pregnancy test, troponins.  Additionally, there is concern for PE versus other acute intra-abdominal pathology.  Imaging modality discussed with attending physician Dr. Rodena Medin.  Will obtain CTA of the chest to rule out PE, as well as imaging of the abdomen pelvis.  CTA is negative for PE or other acute findings in the chest, scan of the abdomen is also negative.  Incidental finding of ovarian cyst as discussed with patient for outpatient GYN follow-up.  Her white blood cell count is elevated to 25,000, hemoglobin appears stable at 11.2.  Metabolic panel with mild transaminitis though this appears to be patient's baseline and unchanged from this.  Lipase within normal limits.  Negative pregnancy.   Troponins are flat and normal. She is given IVF infusion. Pain medication re-dosed.  Unsure of etiology of patient's symptoms though given history of sickle cell anemia, she is at increased risk for morbidities. Do consider crisis with atypical symptoms. Believe she would benefit from observation admission and further assessment. Patient is in agreement. Dr. Toniann Fail accepting admission.  Final Clinical Impression(s) / ED Diagnoses Final diagnoses:  Other chest pain  Upper abdominal pain  Sickle cell anemia with pain [facility])    Rx / DC Orders ED Discharge Orders    None       Robinson, [place] N, PA-C 10/07/20 0005    Wynetta Fines, MD 10/12/20 2153

## 2020-10-06 NOTE — ED Triage Notes (Signed)
Patient is complaining of mid chest pain and abdominal pain that started this morning. Patient has not injured herself.

## 2020-10-07 ENCOUNTER — Encounter (HOSPITAL_COMMUNITY): Payer: Self-pay | Admitting: Internal Medicine

## 2020-10-07 DIAGNOSIS — D72823 Leukemoid reaction: Secondary | ICD-10-CM

## 2020-10-07 LAB — RESP PANEL BY RT-PCR (FLU A&B, COVID) ARPGX2
Influenza A by PCR: NEGATIVE
Influenza B by PCR: NEGATIVE
SARS Coronavirus 2 by RT PCR: NEGATIVE

## 2020-10-07 LAB — CBC WITH DIFFERENTIAL/PLATELET
Abs Immature Granulocytes: 0.12 10*3/uL — ABNORMAL HIGH (ref 0.00–0.07)
Basophils Absolute: 0.4 10*3/uL — ABNORMAL HIGH (ref 0.0–0.1)
Basophils Relative: 1 %
Eosinophils Absolute: 0 10*3/uL (ref 0.0–0.5)
Eosinophils Relative: 0 %
HCT: 26.8 % — ABNORMAL LOW (ref 36.0–46.0)
Hemoglobin: 9.9 g/dL — ABNORMAL LOW (ref 12.0–15.0)
Immature Granulocytes: 0 %
Lymphocytes Relative: 74 %
Lymphs Abs: 20.6 10*3/uL — ABNORMAL HIGH (ref 0.7–4.0)
MCH: 28.4 pg (ref 26.0–34.0)
MCHC: 36.9 g/dL — ABNORMAL HIGH (ref 30.0–36.0)
MCV: 76.8 fL — ABNORMAL LOW (ref 80.0–100.0)
Monocytes Absolute: 2.8 10*3/uL — ABNORMAL HIGH (ref 0.1–1.0)
Monocytes Relative: 10 %
Neutro Abs: 4.2 10*3/uL (ref 1.7–7.7)
Neutrophils Relative %: 15 %
Platelets: 280 10*3/uL (ref 150–400)
RBC: 3.49 MIL/uL — ABNORMAL LOW (ref 3.87–5.11)
RDW: 17.2 % — ABNORMAL HIGH (ref 11.5–15.5)
WBC: 28.1 10*3/uL — ABNORMAL HIGH (ref 4.0–10.5)
nRBC: 1.5 % — ABNORMAL HIGH (ref 0.0–0.2)

## 2020-10-07 LAB — MRSA PCR SCREENING: MRSA by PCR: NEGATIVE

## 2020-10-07 LAB — COMPREHENSIVE METABOLIC PANEL
ALT: 55 U/L — ABNORMAL HIGH (ref 0–44)
AST: 95 U/L — ABNORMAL HIGH (ref 15–41)
Albumin: 3.8 g/dL (ref 3.5–5.0)
Alkaline Phosphatase: 59 U/L (ref 38–126)
Anion gap: 16 — ABNORMAL HIGH (ref 5–15)
BUN: 6 mg/dL (ref 6–20)
CO2: 16 mmol/L — ABNORMAL LOW (ref 22–32)
Calcium: 8.7 mg/dL — ABNORMAL LOW (ref 8.9–10.3)
Chloride: 103 mmol/L (ref 98–111)
Creatinine, Ser: 0.75 mg/dL (ref 0.44–1.00)
GFR, Estimated: >60 mL/min (ref >60)
Glucose, Bld: 96 mg/dL (ref 70–99)
Potassium: 3.7 mmol/L (ref 3.5–5.1)
Sodium: 135 mmol/L (ref 135–145)
Total Bilirubin: 1.5 mg/dL — ABNORMAL HIGH (ref 0.3–1.2)
Total Protein: 7.4 g/dL (ref 6.5–8.1)

## 2020-10-07 MED ORDER — NALOXONE HCL 0.4 MG/ML IJ SOLN: 0.4000 mg | INTRAMUSCULAR | Status: DC | PRN

## 2020-10-07 MED ORDER — HYDROMORPHONE 1 MG/ML IV SOLN
INTRAVENOUS | Status: DC
  Administered 2020-10-07: 30 mg via INTRAVENOUS
  Administered 2020-10-07: 2.5 mg via INTRAVENOUS
  Filled 2020-10-07: qty 30

## 2020-10-07 MED ORDER — DIPHENHYDRAMINE HCL 50 MG/ML IJ SOLN
25.0000 mg | Freq: Once | INTRAMUSCULAR | Status: AC
  Administered 2020-10-07: 25 mg via INTRAVENOUS
  Filled 2020-10-07: qty 1

## 2020-10-07 MED ORDER — ENOXAPARIN SODIUM 40 MG/0.4ML ~~LOC~~ SOLN
40.0000 mg | SUBCUTANEOUS | Status: DC
  Administered 2020-10-07 – 2020-10-11 (×5): 40 mg via SUBCUTANEOUS
  Filled 2020-10-07 (×6): qty 0.4

## 2020-10-07 MED ORDER — ALBUTEROL SULFATE HFA 108 (90 BASE) MCG/ACT IN AERS: 2.0000 | INHALATION_SPRAY | Freq: Four times a day (QID) | RESPIRATORY_TRACT | Status: DC | PRN

## 2020-10-07 MED ORDER — SODIUM CHLORIDE 0.9% FLUSH: 9.0000 mL | INTRAVENOUS | Status: DC | PRN

## 2020-10-07 MED ORDER — ONDANSETRON HCL 4 MG/2ML IJ SOLN: 4.0000 mg | Freq: Four times a day (QID) | INTRAMUSCULAR | Status: DC | PRN

## 2020-10-07 MED ORDER — SODIUM CHLORIDE 0.9 % IV SOLN
2.0000 g | Freq: Three times a day (TID) | INTRAVENOUS | Status: DC
  Administered 2020-10-07 (×2): 2 g via INTRAVENOUS
  Filled 2020-10-07 (×3): qty 2

## 2020-10-07 MED ORDER — SODIUM CHLORIDE 0.45 % IV SOLN: INTRAVENOUS | Status: AC

## 2020-10-07 MED ORDER — KETOROLAC TROMETHAMINE 15 MG/ML IJ SOLN
15.0000 mg | Freq: Four times a day (QID) | INTRAMUSCULAR | Status: DC
  Administered 2020-10-07 – 2020-10-11 (×16): 15 mg via INTRAVENOUS
  Filled 2020-10-07 (×16): qty 1

## 2020-10-07 MED ORDER — POLYETHYLENE GLYCOL 3350 17 G PO PACK: 17.0000 g | PACK | Freq: Every day | ORAL | Status: DC | PRN

## 2020-10-07 MED ORDER — VANCOMYCIN HCL 1250 MG/250ML IV SOLN
1250.0000 mg | Freq: Two times a day (BID) | INTRAVENOUS | Status: DC
  Administered 2020-10-07 (×2): 1250 mg via INTRAVENOUS
  Filled 2020-10-07 (×2): qty 250

## 2020-10-07 MED ORDER — ONDANSETRON HCL 4 MG/2ML IJ SOLN
4.0000 mg | Freq: Four times a day (QID) | INTRAMUSCULAR | Status: DC | PRN
  Administered 2020-10-08 (×2): 4 mg via INTRAVENOUS
  Filled 2020-10-07 (×2): qty 2

## 2020-10-07 MED ORDER — DIPHENHYDRAMINE HCL 12.5 MG/5ML PO ELIX
12.5000 mg | ORAL_SOLUTION | ORAL | Status: DC | PRN
  Administered 2020-10-07 – 2020-10-08 (×2): 12.5 mg via ORAL
  Filled 2020-10-07 (×2): qty 5

## 2020-10-07 MED ORDER — KETOROLAC TROMETHAMINE 15 MG/ML IJ SOLN
15.0000 mg | Freq: Four times a day (QID) | INTRAMUSCULAR | Status: DC
  Administered 2020-10-07 (×2): 15 mg via INTRAVENOUS
  Filled 2020-10-07 (×2): qty 1

## 2020-10-07 MED ORDER — SENNOSIDES-DOCUSATE SODIUM 8.6-50 MG PO TABS
1.0000 | ORAL_TABLET | Freq: Two times a day (BID) | ORAL | Status: DC
  Administered 2020-10-07 – 2020-10-11 (×7): 1 via ORAL
  Filled 2020-10-07 (×9): qty 1

## 2020-10-07 MED ORDER — AMPHETAMINE-DEXTROAMPHETAMINE 10 MG PO TABS
15.0000 mg | ORAL_TABLET | Freq: Every day | ORAL | Status: DC
  Filled 2020-10-07 (×3): qty 2

## 2020-10-07 MED ORDER — HYDROMORPHONE 1 MG/ML IV SOLN
INTRAVENOUS | Status: DC
Start: 2020-10-07 — End: 2020-10-09
  Administered 2020-10-07 (×2): 1 mg via INTRAVENOUS
  Administered 2020-10-08: 3.5 mg via INTRAVENOUS
  Administered 2020-10-08: 1 mg via INTRAVENOUS
  Administered 2020-10-08: 0.8 mg via INTRAVENOUS
  Administered 2020-10-09: 1.5 mg via INTRAVENOUS
  Administered 2020-10-09: 1 mg via INTRAVENOUS

## 2020-10-07 NOTE — Progress Notes (Signed)
Shelby Coffey is a is a 20 year old female with a medical history significant for sickle cell disease that was admitted overnight and sickle cell crisis.  Patient reports sudden onset of pain 1 day ago.  She typically has very well-controlled sickle cell disease.  She currently does not have a PCP and does not follow with hematology.  Patient is opiate nave, she rarely takes opiates for pain management.  However she states that pain persists despite IV Dilaudid PCA.  She has not been able to get relief since admission.  She rates her pain as 8/10 primarily to central chest, upper and lower extremities. She denies any headache, shortness of breath, dizziness, urinary symptoms, nausea, vomiting, or diarrhea.  Patient endorses some abdominal pain.  Reviewed CT of abdomen and pelvis, mostly unremarkable.   Care plan: Discontinue IV antibiotics.  Review CT of abdomen and pelvis, shows a 2.8 cm cystic lesion within the left ovary, no follow-up imaging recommended.  No central or segmental pulmonary embolism noted.  Also, no acute intrathoracic abnormalities. Also reviewed CT of chest, unremarkable.  Blood cultures negative.  Urinalysis completely normal.  No antibiotics warranted at this time.  Leukocytosis is thought to be reactive and secondary to vaso-occlusive pain crisis.  We will continue to monitor closely without antibiotics  Discontinue full dose Dilaudid PCA due to lack of pain control.  Custom dose PCA with settings of 0.5 mg, 10-minute lockout, and 2 mg/h.  Reassess in a.m.  Nolon Nations  APRN, MSN, FNP-C Patient Care [facility] [address] [place], [place] [address] [phone]

## 2020-10-07 NOTE — H&P (Signed)
History and Physical    Shelby JulianJamia Mcquown ZOX:[id number] DOB: 2001-04-06 DOA: 10/06/2020  PCP: Genene ChurnGardner, Faith Lockett, MD  Patient coming from: Home.  Chief Complaint: Chest pain and abdominal pain.  HPI: Shelby Coffey is a 20 y.o. female with history of sickle cell anemia and asthma presents to the ER with complaint of having chest pain and abdominal pain since yesterday morning.  Denies any nausea vomiting diarrhea fever chills or productive cough.  Patient states she was diagnosed with Covid infection January 2022 about 2 months ago.  Pain is mostly constant at times throbbing in nature at times sharp.  Had some alcohol 2 days ago.  But patient states that he occasionally drinks alcohol.  Not a regular drinker.  ED Course: In the ER patient continued to have pain and labs show significant leukocytosis of 25 and mildly febrile at 99 F.  High sensitive troponin was negative and EKG was showing sinus tachycardia.  AST and ALT are elevated at 120 and 69 lipase was normal at 25.  CT angiogram of the chest and abdomen pelvis was unremarkable.  Blood cultures were obtained and started on empiric antibiotics and admitted for SIRS picture with sickle cell pain crisis.  Covid test negative.  Review of Systems: As per HPI, rest all negative.   Past Medical History:  Diagnosis Date  . Asthma   . Scoliosis   . Sickle cell anemia (HCC)     History reviewed. No pertinent surgical history.   reports that she has never smoked. She has never used smokeless tobacco. She reports current drug use. Drug: Marijuana. She reports that she does not drink alcohol.  No Known Allergies  Family History  Problem Relation Age of Onset  . Breast cancer Mother   . Sickle cell trait Sister   . Diabetes type II Maternal Grandmother     Prior to Admission medications   Medication Sig Start Date End Date Taking? Authorizing Provider  acetaminophen (TYLENOL) 650 MG CR tablet Take 650 mg by mouth every 8 (eight)  hours as needed for pain.   Yes [provider]  albuterol (PROVENTIL HFA;VENTOLIN HFA) 108 (90 Base) MCG/ACT inhaler Inhale 2 puffs into the lungs every 6 (six) hours as needed for wheezing or shortness of breath.   Yes [provider]  amphetamine-dextroamphetamine (ADDERALL) 15 MG tablet Take 15 mg by mouth daily.   Yes [provider]  CHLOROPHYLL PO Take 1 capsule by mouth daily.   Yes [provider]  DULoxetine (CYMBALTA) 30 MG capsule Take 30 mg by mouth daily.   Yes [provider]  ELDERBERRY PO Take 1 capsule by mouth daily.   Yes [provider]  etonogestrel (NEXPLANON) 68 MG IMPL implant 68 mg by Subdermal route continuous.   Yes [provider]  OVER THE COUNTER MEDICATION Take 1 capsule by mouth daily. Sea moss   Yes [provider]  oxyCODONE (OXY IR/ROXICODONE) 5 MG immediate release tablet Take 1 tablet (5 mg total) by mouth every 4 (four) hours as needed for severe pain. 07/26/20  Yes Massie MaroonHollis, Lachina M, FNP  hydrOXYzine (ATARAX/VISTARIL) 25 MG tablet Take 25 mg by mouth daily as needed for anxiety. Patient not taking: Reported on 10/06/2020    [provider]  ibuprofen (ADVIL) 800 MG tablet Take 1 tablet (800 mg total) by mouth 3 (three) times daily. Patient not taking: No sig reported 07/26/20   Massie MaroonHollis, Lachina M, FNP    Physical Exam: Constitutional: Moderately built  and nourished. Vitals:   10/06/20 1935 10/06/20 2200 10/06/20 2311 10/07/20 0018  BP: 128/82 126/69    Pulse: (!) 105 (!) 101    Resp: 12 (!) 23    Temp: 99.5 F (37.5 C)  99.3 F (37.4 C) 99.9 F (37.7 C)  TempSrc: Oral  Oral Oral  SpO2: 100% 99%    Weight:      Height:       Eyes: Anicteric no pallor. ENMT: No discharge from the ears eyes nose or mouth. Neck: No mass felt.  No neck rigidity. Respiratory: No rhonchi or crepitations. Cardiovascular: S1-S2 heard. Abdomen: Soft nontender bowel sounds  present. Musculoskeletal: No edema. Skin: No rash. Neurologic: Alert awake oriented to time place and person.  Moves all extremities. Psychiatric: Appears normal.  Normal affect.   Labs on Admission: I have personally reviewed following labs and imaging studies  CBC: Recent Labs  Lab 10/06/20 2016  WBC 25.1*  HGB 11.2*  HCT 30.3*  MCV 76.5*  PLT 349   Basic Metabolic Panel: Recent Labs  Lab 10/06/20 1941  NA 137  K 4.5  CL 104  CO2 24  GLUCOSE 82  BUN 5*  CREATININE 0.69  CALCIUM 9.4   GFR: Estimated Creatinine Clearance: 109.1 mL/min (by C-G formula based on SCr of 0.69 mg/dL). Liver Function Tests: Recent Labs  Lab 10/06/20 1941  AST 120*  ALT 69*  ALKPHOS 67  BILITOT 1.3*  PROT 8.4*  ALBUMIN 4.4   Recent Labs  Lab 10/06/20 1941  LIPASE 25   No results for input(s): AMMONIA in the last 168 hours. Coagulation Profile: No results for input(s): INR, PROTIME in the last 168 hours. Cardiac Enzymes: No results for input(s): CKTOTAL, CKMB, CKMBINDEX, TROPONINI in the last 168 hours. BNP (last 3 results) No results for input(s): PROBNP in the last 8760 hours. HbA1C: No results for input(s): HGBA1C in the last 72 hours. CBG: No results for input(s): GLUCAP in the last 168 hours. Lipid Profile: No results for input(s): CHOL, HDL, LDLCALC, TRIG, CHOLHDL, LDLDIRECT in the last 72 hours. Thyroid Function Tests: No results for input(s): TSH, T4TOTAL, FREET4, T3FREE, THYROIDAB in the last 72 hours. Anemia Panel: Recent Labs    10/06/20 2016  RETICCTPCT 3.6*   Urine analysis:    Component Value Date/Time   COLORURINE YELLOW 10/06/2020 1939   APPEARANCEUR CLEAR 10/06/2020 1939   LABSPEC 1.011 10/06/2020 1939   PHURINE 7.0 10/06/2020 1939   GLUCOSEU NEGATIVE 10/06/2020 1939   HGBUR NEGATIVE 10/06/2020 1939   BILIRUBINUR NEGATIVE 10/06/2020 1939   KETONESUR 5 (A) 10/06/2020 1939   PROTEINUR NEGATIVE 10/06/2020 1939   UROBILINOGEN 1.0 03/02/2008 1932    NITRITE NEGATIVE 10/06/2020 1939   LEUKOCYTESUR NEGATIVE 10/06/2020 1939   Sepsis Labs: @LABRCNTIP (procalcitonin:4,lacticidven:4) )No results found for this or any previous visit (from the past 240 hour(s)).   Radiological Exams on Admission: DG Chest 2 View  Result Date: 10/06/2020 CLINICAL DATA:  Central chest pain, abdominal pain EXAM: CHEST - 2 VIEW COMPARISON:  07/20/2020 FINDINGS: Frontal and lateral views of the chest demonstrate a stable cardiac silhouette. No acute airspace disease, effusion, or pneumothorax. Right convex thoracic scoliosis again noted. No acute bony abnormality. IMPRESSION: 1. No acute intrathoracic process. Electronically Signed   By: 07/22/2020 M.D.   On: 10/06/2020 19:59   CT Angio Chest PE W/Cm &/Or Wo Cm  Result Date: 10/06/2020 CLINICAL DATA:  Nonlocalized acute abdominal pain, chest pain. Sickle cell. EXAM: CT ANGIOGRAPHY CHEST CT ABDOMEN  AND PELVIS WITH CONTRAST TECHNIQUE: Multidetector CT imaging of the chest was performed using the standard protocol during bolus administration of intravenous contrast. Multiplanar CT image reconstructions and MIPs were obtained to evaluate the vascular anatomy. Multidetector CT imaging of the abdomen and pelvis was performed using the standard protocol during bolus administration of intravenous contrast. CONTRAST:  OMNIPAQUE IOHEXOL 350 MG/ML SOLN COMPARISON:  Chest x-ray 02/08/2017, 06/28/2018, 07/20/2020. CT angiography chest 07/22/2020 FINDINGS: CTA CHEST FINDINGS Cardiovascular: Satisfactory opacification of the pulmonary arteries to the segmental level. No evidence of pulmonary embolism. Normal heart size. No pericardial effusion. Mediastinum/Nodes: No enlarged mediastinal, hilar, or axillary lymph nodes. Thyroid gland, trachea, and esophagus demonstrate no significant findings. Lungs/Pleura: Bilateral lower lobe subsegmental atelectasis. Trace linear atelectasis within the lingula. No focal consolidation. No  pulmonary nodule or mass. No pleural effusion or pneumothorax. Musculoskeletal: No chest wall abnormality. No acute or significant osseous findings. Dextroscoliosis of thoracic spine appears similar to prior. Review of the MIP images confirms the above findings. CT ABDOMEN and PELVIS FINDINGS Hepatobiliary: No focal liver abnormality is seen. No gallstones, gallbladder wall thickening, or biliary dilatation. Pancreas: Unremarkable. No pancreatic ductal dilatation or surrounding inflammatory changes. Spleen: Normal in size without focal abnormality. Adrenals/Urinary Tract: Adrenal glands are unremarkable. Kidneys are normal, without renal calculi, focal lesion, or hydronephrosis. Bladder is unremarkable. Stomach/Bowel: Stomach is within normal limits. Appendix appears normal. No evidence of bowel wall thickening, distention, or inflammatory changes. Vascular/Lymphatic: No significant vascular findings are present. No enlarged abdominal or pelvic lymph nodes. Reproductive: Incidentally noted 2.8 cm cystic lesion within the left ovary. No follow-up imaging recommended. Note: This recommendation does not apply to premenarchal patients and to those with increased risk (genetic, family history, elevated tumor markers or other high-risk factors) of ovarian cancer. Reference: JACR 2020 Feb; 17(2):248-254. Otherwise the uterus and bilateral adnexa are unremarkable. Other: No intraperitoneal free fluid. No intraperitoneal free gas. No organized fluid collection. Musculoskeletal: No acute or significant osseous findings. Review of the MIP images confirms the above findings. IMPRESSION: 1. No central or segmental pulmonary embolus. 2. No acute intrathoracic abnormality. 3. No acute intra-abdominal or intrapelvic abnormality. 4. Dextroscoliosis of the thoracic spine. Electronically Signed   By: Tish Frederickson M.D.   On: 10/06/2020 22:55   CT Abdomen Pelvis W Contrast  Result Date: 10/06/2020 CLINICAL DATA:  Nonlocalized  acute abdominal pain, chest pain. Sickle cell. EXAM: CT ANGIOGRAPHY CHEST CT ABDOMEN AND PELVIS WITH CONTRAST TECHNIQUE: Multidetector CT imaging of the chest was performed using the standard protocol during bolus administration of intravenous contrast. Multiplanar CT image reconstructions and MIPs were obtained to evaluate the vascular anatomy. Multidetector CT imaging of the abdomen and pelvis was performed using the standard protocol during bolus administration of intravenous contrast. CONTRAST:  OMNIPAQUE IOHEXOL 350 MG/ML SOLN COMPARISON:  Chest x-ray 02/08/2017, 06/28/2018, 07/20/2020. CT angiography chest 07/22/2020 FINDINGS: CTA CHEST FINDINGS Cardiovascular: Satisfactory opacification of the pulmonary arteries to the segmental level. No evidence of pulmonary embolism. Normal heart size. No pericardial effusion. Mediastinum/Nodes: No enlarged mediastinal, hilar, or axillary lymph nodes. Thyroid gland, trachea, and esophagus demonstrate no significant findings. Lungs/Pleura: Bilateral lower lobe subsegmental atelectasis. Trace linear atelectasis within the lingula. No focal consolidation. No pulmonary nodule or mass. No pleural effusion or pneumothorax. Musculoskeletal: No chest wall abnormality. No acute or significant osseous findings. Dextroscoliosis of thoracic spine appears similar to prior. Review of the MIP images confirms the above findings. CT ABDOMEN and PELVIS FINDINGS Hepatobiliary: No focal liver abnormality is seen.  No gallstones, gallbladder wall thickening, or biliary dilatation. Pancreas: Unremarkable. No pancreatic ductal dilatation or surrounding inflammatory changes. Spleen: Normal in size without focal abnormality. Adrenals/Urinary Tract: Adrenal glands are unremarkable. Kidneys are normal, without renal calculi, focal lesion, or hydronephrosis. Bladder is unremarkable. Stomach/Bowel: Stomach is within normal limits. Appendix appears normal. No evidence of bowel wall thickening,  distention, or inflammatory changes. Vascular/Lymphatic: No significant vascular findings are present. No enlarged abdominal or pelvic lymph nodes. Reproductive: Incidentally noted 2.8 cm cystic lesion within the left ovary. No follow-up imaging recommended. Note: This recommendation does not apply to premenarchal patients and to those with increased risk (genetic, family history, elevated tumor markers or other high-risk factors) of ovarian cancer. Reference: JACR 2020 Feb; 17(2):248-254. Otherwise the uterus and bilateral adnexa are unremarkable. Other: No intraperitoneal free fluid. No intraperitoneal free gas. No organized fluid collection. Musculoskeletal: No acute or significant osseous findings. Review of the MIP images confirms the above findings. IMPRESSION: 1. No central or segmental pulmonary embolus. 2. No acute intrathoracic abnormality. 3. No acute intra-abdominal or intrapelvic abnormality. 4. Dextroscoliosis of the thoracic spine. Electronically Signed   By: Tish Frederickson M.D.   On: 10/06/2020 22:55    EKG: Independently reviewed.  Sinus tachycardia.  Assessment/Plan Principal Problem:   Sickle cell pain crisis (HCC) Active Problems:   Leukocytosis    1. Sickle cell pain crisis/SIRS -CT chest and abdomen was unremarkable.  At this time patient's pain is likely could be from sickle cell pain crisis.  Given that patient recently had acute chest syndrome with patient has significant leukocytosis mildly febrile we will keep patient on empiric antibiotics and pain really medication IV fluids and I started patient on Dilaudid. 2. Elevated LFTs -per CT scan gallbladder and the biliary system does not show any acute changes.  Patient does have epigastric tenderness.  Continue to monitor LFTs.  Check sonogram of the abdomen. 3. Anemia secondary to sickle cell disease follow CBC. 4. Asthma not actively wheezing.  Given the patient has sickle cell anemia and is requiring Dilaudid PCA for pain  relief will need inpatient status.   DVT prophylaxis: Lovenox. Code Status: Full code. Family Communication: Discussed with patient. Disposition Plan: Home. Consults called: None. Admission status: Inpatient.   Eduard Clos MD [facility] Pager [phone].  If 7PM-7AM, please contact night-coverage [email or web address] Password [facility]  10/07/2020, 12:59 AM

## 2020-10-07 NOTE — Progress Notes (Signed)
Pharmacy Antibiotic Note  Shelby Coffey is a 20 y.o. female admitted on 10/06/2020 with  past medical history of sickle cell anemia, asthma, acute chest syndrome, presenting emergency department with complaint of abdominal pain and chest pain that began this morning.   Pharmacy has been consulted to dose vancomycin and cefepime for sepsis.  Plan: Vancomycin 1250mg  IV q12h (AUC 482.5, Scr 0.69) Cefepime 2gm IV q8h Follow renal function, cultures and clinical course  Height: 5\' 7"  (170.2 cm) Weight: 65.8 kg (145 lb) IBW/kg (Calculated) : 61.6  Temp (24hrs), Avg:99.6 F (37.6 C), Min:99.3 F (37.4 C), Max:99.9 F (37.7 C)  Recent Labs  Lab 10/06/20 1941 10/06/20 2016  WBC  --  25.1*  CREATININE 0.69  --     Estimated Creatinine Clearance: 109.1 mL/min (by C-G formula based on SCr of 0.69 mg/dL).    No Known Allergies  Antimicrobials this admission: 3/14 vanc >> 3/14 cefepime >> Dose adjustments this admission:   Microbiology results: 3/14 BCx:   Thank you for allowing pharmacy to be a part of this patient's care.  4/14 RPh 10/07/2020, 1:06 AM

## 2020-10-08 LAB — CBC WITH DIFFERENTIAL/PLATELET
Abs Immature Granulocytes: 0 10*3/uL (ref 0.00–0.07)
Band Neutrophils: 10 %
Basophils Absolute: 0.3 10*3/uL — ABNORMAL HIGH (ref 0.0–0.1)
Basophils Relative: 1 %
Blasts: 0 %
Eosinophils Absolute: 0.3 10*3/uL (ref 0.0–0.5)
Eosinophils Relative: 1 %
HCT: 30.3 % — ABNORMAL LOW (ref 36.0–46.0)
Hemoglobin: 11.2 g/dL — ABNORMAL LOW (ref 12.0–15.0)
Lymphocytes Relative: 48 %
Lymphs Abs: 11.9 10*3/uL — ABNORMAL HIGH (ref 0.7–4.0)
MCH: 28.3 pg (ref 26.0–34.0)
MCHC: 37 g/dL — ABNORMAL HIGH (ref 30.0–36.0)
MCV: 76.5 fL — ABNORMAL LOW (ref 80.0–100.0)
Metamyelocytes Relative: 0 %
Monocytes Absolute: 3.3 10*3/uL — ABNORMAL HIGH (ref 0.1–1.0)
Monocytes Relative: 13 %
Myelocytes: 0 %
Neutro Abs: 9.3 10*3/uL — ABNORMAL HIGH (ref 1.7–7.7)
Neutrophils Relative %: 27 %
Platelets: 349 10*3/uL (ref 150–400)
Promyelocytes Relative: 0 %
RBC: 3.96 MIL/uL (ref 3.87–5.11)
RDW: 17.5 % — ABNORMAL HIGH (ref 11.5–15.5)
WBC: 25.1 10*3/uL — ABNORMAL HIGH (ref 4.0–10.5)
nRBC: 1.3 % — ABNORMAL HIGH (ref 0.0–0.2)
nRBC: 3 /100 WBC — ABNORMAL HIGH

## 2020-10-08 LAB — CBC
HCT: 26 % — ABNORMAL LOW (ref 36.0–46.0)
Hemoglobin: 9.5 g/dL — ABNORMAL LOW (ref 12.0–15.0)
MCH: 28.3 pg (ref 26.0–34.0)
MCHC: 36.5 g/dL — ABNORMAL HIGH (ref 30.0–36.0)
MCV: 77.4 fL — ABNORMAL LOW (ref 80.0–100.0)
Platelets: 282 10*3/uL (ref 150–400)
RBC: 3.36 MIL/uL — ABNORMAL LOW (ref 3.87–5.11)
RDW: 17.2 % — ABNORMAL HIGH (ref 11.5–15.5)
WBC: 25 10*3/uL — ABNORMAL HIGH (ref 4.0–10.5)
nRBC: 1.2 % — ABNORMAL HIGH (ref 0.0–0.2)

## 2020-10-08 MED ORDER — OXYCODONE HCL 5 MG PO TABS
10.0000 mg | ORAL_TABLET | ORAL | Status: DC | PRN
Start: 2020-10-08 — End: 2020-10-11
  Administered 2020-10-09 – 2020-10-11 (×6): 10 mg via ORAL
  Filled 2020-10-08 (×7): qty 2

## 2020-10-08 MED ORDER — SODIUM CHLORIDE 0.45 % IV SOLN: Freq: Once | INTRAVENOUS | Status: AC

## 2020-10-08 MED ORDER — SODIUM CHLORIDE 0.45 % IV SOLN: INTRAVENOUS | Status: AC

## 2020-10-09 DIAGNOSIS — R1010 Upper abdominal pain, unspecified: Secondary | ICD-10-CM | POA: Diagnosis not present

## 2020-10-09 DIAGNOSIS — D5700 Hb-SS disease with crisis, unspecified: Secondary | ICD-10-CM | POA: Diagnosis not present

## 2020-10-09 LAB — CBC
HCT: 25.6 % — ABNORMAL LOW (ref 36.0–46.0)
Hemoglobin: 9.2 g/dL — ABNORMAL LOW (ref 12.0–15.0)
MCH: 28.4 pg (ref 26.0–34.0)
MCHC: 35.9 g/dL (ref 30.0–36.0)
MCV: 79 fL — ABNORMAL LOW (ref 80.0–100.0)
Platelets: 276 10*3/uL (ref 150–400)
RBC: 3.24 MIL/uL — ABNORMAL LOW (ref 3.87–5.11)
RDW: 17.8 % — ABNORMAL HIGH (ref 11.5–15.5)
WBC: 21.5 10*3/uL — ABNORMAL HIGH (ref 4.0–10.5)
nRBC: 1.8 % — ABNORMAL HIGH (ref 0.0–0.2)

## 2020-10-09 MED ORDER — ACETAMINOPHEN 325 MG PO TABS
650.0000 mg | ORAL_TABLET | Freq: Four times a day (QID) | ORAL | Status: DC | PRN
  Administered 2020-10-09: 650 mg via ORAL
  Filled 2020-10-09: qty 2

## 2020-10-09 MED ORDER — HYDROMORPHONE HCL 1 MG/ML IJ SOLN: 1.0000 mg | INTRAMUSCULAR | Status: DC | PRN

## 2020-10-09 NOTE — Progress Notes (Signed)
Subjective: Shelby Coffey is a 20 year old female with a medical history significant for sickle cell disease was admitted for sickle cell pain crisis. Patient says that pain has improved some overnight.  She does not have any new complaints.  She rates pain as 5/10.  Patient says that she cannot function at home at current pain intensity. She denies any headache, shortness of breath, chest pain, urinary symptoms, nausea, vomiting, or diarrhea. Objective:  Vital signs in last 24 hours:  Vitals:   10/09/20 1142 10/09/20 1335 10/09/20 1633 10/09/20 2035  BP:  115/78 118/75 116/74  Pulse:  82 98 (!) 102  Resp: 16 16 14 14   Temp:  98.3 F (36.8 C) 98.3 F (36.8 C) 98.3 F (36.8 C)  TempSrc:  Oral Oral Oral  SpO2: 94% 93% 94% 98%  Weight:      Height:        Intake/Output from previous day:   Intake/Output Summary (Last 24 hours) at 10/09/2020 2202 Last data filed at 10/09/2020 1700 Gross per 24 hour  Intake 2031.21 ml  Output -  Net 2031.21 ml    Physical Exam: General: Alert, awake, oriented x3, in no acute distress.  HEENT: [place]/AT PEERL, EOMI Neck: Trachea midline,  no masses, no thyromegal,y no JVD, no carotid bruit OROPHARYNX:  Moist, No exudate/ erythema/lesions.  Heart: Regular rate and rhythm, without murmurs, rubs, gallops, PMI non-displaced, no heaves or thrills on palpation.  Lungs: Clear to auscultation, no wheezing or rhonchi noted. No increased vocal fremitus resonant to percussion  Abdomen: Soft, nontender, nondistended, positive bowel sounds, no masses no hepatosplenomegaly noted..  Neuro: No focal neurological deficits noted cranial nerves II through XII grossly intact. DTRs 2+ bilaterally upper and lower extremities. Strength 5 out of 5 in bilateral upper and lower extremities. Musculoskeletal: No warm swelling or erythema around joints, no spinal tenderness noted. Psychiatric: Patient alert and oriented x3, good insight and cognition, good recent to remote  recall. Lymph node survey: No cervical axillary or inguinal lymphadenopathy noted.  Lab Results:  Basic Metabolic Panel:    Component Value Date/Time   NA 135 10/07/2020 0517   K 3.7 10/07/2020 0517   CL 103 10/07/2020 0517   CO2 16 (L) 10/07/2020 0517   BUN 6 10/07/2020 0517   CREATININE 0.75 10/07/2020 0517   GLUCOSE 96 10/07/2020 0517   CALCIUM 8.7 (L) 10/07/2020 0517   CBC:    Component Value Date/Time   WBC 21.5 (H) 10/09/2020 1218   HGB 9.2 (L) 10/09/2020 1218   HCT 25.6 (L) 10/09/2020 1218   PLT 276 10/09/2020 1218   MCV 79.0 (L) 10/09/2020 1218   NEUTROABS 4.2 10/07/2020 0517   LYMPHSABS 20.6 (H) 10/07/2020 0517   MONOABS 2.8 (H) 10/07/2020 0517   EOSABS 0.0 10/07/2020 0517   BASOSABS 0.4 (H) 10/07/2020 0517    Recent Results (from the past 240 hour(s))  Culture, blood (routine x 2)     Status: None (Preliminary result)   Collection Time: 10/07/20 12:00 AM   Specimen: BLOOD  Result Value Ref Range Status   Specimen Description   Final    BLOOD RIGHT ANTECUBITAL Performed at [facility], [address], [place], [place]    Special Requests   Final    BOTTLES DRAWN AEROBIC AND ANAEROBIC Blood Culture adequate volume Performed at [facility], [address] [place], [place]    Culture   Final    NO GROWTH 2 DAYS Performed at  [facility], [address] [place], [place] [address]    Report Status PENDING  Incomplete  Resp Panel by RT-PCR (Flu A&B, Covid) Nasopharyngeal Swab     Status: None   Collection Time: 10/07/20 12:03 AM   Specimen: Nasopharyngeal Swab; Nasopharyngeal(NP) swabs in vial transport medium  Result Value Ref Range Status   SARS Coronavirus 2 by RT PCR NEGATIVE NEGATIVE Final    Comment: (NOTE) SARS-CoV-2 target nucleic acids are NOT DETECTED.  The SARS-CoV-2 RNA is generally detectable in upper respiratory specimens during the acute phase of infection. The  lowest concentration of SARS-CoV-2 viral copies this assay can detect is 138 copies/mL. A negative result does not preclude SARS-Cov-2 infection and should not be used as the sole basis for treatment or other patient management decisions. A negative result may occur with  improper specimen collection/handling, submission of specimen other than nasopharyngeal swab, presence of viral mutation(s) within the areas targeted by this assay, and inadequate number of viral copies(<138 copies/mL). A negative result must be combined with clinical observations, patient history, and epidemiological information. The expected result is Negative.  Fact Sheet for Patients:  BloggerCourse.com  Fact Sheet for Healthcare Providers:  SeriousBroker.it  This test is no t yet approved or cleared by the [place] FDA and  has been authorized for detection and/or diagnosis of SARS-CoV-2 by FDA under an Emergency Use Authorization (EUA). This EUA will remain  in effect (meaning this test can be used) for the duration of the COVID-19 declaration under Section 564(b)(1) of the Act, 21 U.S.C.section 360bbb-3(b)(1), unless the authorization is terminated  or revoked sooner.       Influenza A by PCR NEGATIVE NEGATIVE Final   Influenza B by PCR NEGATIVE NEGATIVE Final    Comment: (NOTE) The Xpert Xpress SARS-CoV-2/FLU/RSV plus assay is intended as an aid in the diagnosis of influenza from Nasopharyngeal swab specimens and should not be used as a sole basis for treatment. Nasal washings and aspirates are unacceptable for Xpert Xpress SARS-CoV-2/FLU/RSV testing.  Fact Sheet for Patients: BloggerCourse.com  Fact Sheet for Healthcare Providers: SeriousBroker.it  This test is not yet approved or cleared by the [place] FDA and has been authorized for detection and/or diagnosis of SARS-CoV-2 by FDA under  an Emergency Use Authorization (EUA). This EUA will remain in effect (meaning this test can be used) for the duration of the COVID-19 declaration under Section 564(b)(1) of the Act, 21 U.S.C. section 360bbb-3(b)(1), unless the authorization is terminated or revoked.  Performed at [facility], [address] [place], [place] [address]   Culture, blood (routine x 2)     Status: None (Preliminary result)   Collection Time: 10/07/20 12:20 AM   Specimen: BLOOD  Result Value Ref Range Status   Specimen Description   Final    BLOOD RIGHT ANTECUBITAL Performed at [facility], [address] [place], [place] [address]    Special Requests   Final    BOTTLES DRAWN AEROBIC AND ANAEROBIC Blood Culture adequate volume Performed at [facility], [address], [place], [place] [address]    Culture   Final    NO GROWTH 2 DAYS Performed at [facility], [address], [place], [place] [address]    Report Status PENDING  Incomplete  MRSA PCR Screening     Status: None   Collection Time: 10/07/20 10:44 AM   Specimen: Nasal Mucosa; Nasopharyngeal  Result Value Ref Range Status  MRSA by PCR NEGATIVE NEGATIVE Final    Comment:        The GeneXpert MRSA Assay (FDA approved for NASAL specimens only), is one component of a comprehensive MRSA colonization surveillance program. It is not intended to diagnose MRSA infection nor to guide or monitor treatment for MRSA infections. Performed at [facility], [address] [place], [place] [address]     Studies/Results: No results found.  Medications: Scheduled Meds: . amphetamine-dextroamphetamine  15 mg Oral Daily  . enoxaparin (LOVENOX) injection  40 mg Subcutaneous Q24H  . ketorolac  15 mg Intravenous Q6H  . senna-docusate  1 tablet Oral BID   Continuous Infusions: PRN Meds:.acetaminophen, albuterol, HYDROmorphone (DILAUDID) injection, oxyCODONE,  polyethylene glycol  Consultants:  None  Procedures:  None  Antibiotics:  None  Assessment/Plan: Principal Problem:   Sickle cell pain crisis (HCC) Active Problems:   Leukocytosis  Sickle cell disease with pain crisis: Decrease IV fluids to KVO Discontinue IV Dilaudid PCA.  Dilaudid 1 mg IV every 4 hours as needed. Oxycodone 10 mg every 4 hours as needed for severe breakthrough pain Continue Toradol 15 mg IV every 6 hours for total of 5 days Monitor vital signs very closely, reevaluate pain scale regularly, and supplemental oxygen as needed.  Leukocytosis: Improving.  Patient continues to be afebrile without any signs of inflammation.  Continue to monitor closely without antibiotics.  Sickle cell anemia: Hemoglobin is stable and consistent with patient's baseline.  There is no clinical indication for blood transfusion.  Continue to follow closely without antibiotics.  Code Status: Full Code Family Communication: N/A Disposition Plan: Not yet ready for discharge  Shelby Coffey Rennis Petty  APRN, MSN, FNP-C [facility] [address] [place], [place] [address] [phone]  If 5PM-7AM, please contact night-coverage.  10/09/2020, 10:02 PM  LOS: 3 days

## 2020-10-10 LAB — CBC
HCT: 23.9 % — ABNORMAL LOW (ref 36.0–46.0)
Hemoglobin: 8.8 g/dL — ABNORMAL LOW (ref 12.0–15.0)
MCH: 28.2 pg (ref 26.0–34.0)
MCHC: 36.8 g/dL — ABNORMAL HIGH (ref 30.0–36.0)
MCV: 76.6 fL — ABNORMAL LOW (ref 80.0–100.0)
Platelets: 286 10*3/uL (ref 150–400)
RBC: 3.12 MIL/uL — ABNORMAL LOW (ref 3.87–5.11)
RDW: 17.4 % — ABNORMAL HIGH (ref 11.5–15.5)
WBC: 16.4 10*3/uL — ABNORMAL HIGH (ref 4.0–10.5)
nRBC: 3 % — ABNORMAL HIGH (ref 0.0–0.2)

## 2020-10-11 ENCOUNTER — Encounter: Payer: Self-pay | Admitting: Family Medicine

## 2020-10-11 DIAGNOSIS — D5700 Hb-SS disease with crisis, unspecified: Secondary | ICD-10-CM | POA: Diagnosis not present

## 2020-10-11 MED ORDER — IBUPROFEN 800 MG PO TABS: 800.0000 mg | ORAL_TABLET | Freq: Three times a day (TID) | ORAL | 0 refills | Status: AC

## 2020-10-11 MED ORDER — OXYCODONE HCL 5 MG PO TABS: 5.0000 mg | ORAL_TABLET | ORAL | 0 refills | Status: DC | PRN

## 2020-10-11 MED ORDER — FOLIC ACID 1 MG PO TABS: 1.0000 mg | ORAL_TABLET | Freq: Every day | ORAL | 11 refills | Status: AC

## 2020-10-11 NOTE — Discharge Summary (Signed)
Physician Discharge Summary  Shelby Coffey FAO:[id number] DOB: 02/07/2001 DOA: 10/06/2020  PCP: Genene Churn, MD  Admit date: 10/06/2020  Discharge date: 10/11/2020  Discharge Diagnoses:  Principal Problem:   Sickle cell pain crisis (HCC) Active Problems:   Sickle cell anemia with pain (HCC)   Leukocytosis   Upper abdominal pain   Discharge Condition: Stable  Disposition:   Follow-up Information    Genene Churn, MD Follow up in 2 week(s).   Specialty: Pediatrics Contact information: [address] [place] [address] [phone]              Pt is discharged home in good condition and is to follow up with Julian Reil, Fernande Boyden, MD this week to have labs evaluated. Shelby Coffey is instructed to increase activity slowly and balance with rest for the next few days, and use prescribed medication to complete treatment of pain  Diet: Regular Wt Readings from Last 3 Encounters:  10/06/20 65.8 kg  07/23/20 69.6 kg  03/20/20 63.6 kg (70 %, Z= 0.52)*   * Growth percentiles are based on CDC (Girls, 2-20 Years) data.    History of present illness:  Shelby Coffey is a 20 year old female with a medical history significant for sickle cell disease and mild intermittent asthma presented to the ER with complaints of chest pain and abdominal pain since 1 day prior.  Patient denied any nausea, vomiting, diarrhea, fever, chills, or productive cough.  Patient states that she was diagnosed with COVID-19 infection in January 2022, which is about 2 months ago.  Pain is mostly constant at times characterized as throbbing in nature. Patient had some alcohol 2 days prior to admission, she is an infrequent alcohol user.  Patient has not identified any palliative or provocative factors concerning current crisis.  ER course: In the ER, patient continued to have pain and labs showed significant leukocytosis of 25,000 and mildly febrile at 99 F.  High sensitive troponin  was negative and EKG was showing sinus tachycardia.  AST and ALT are elevated at 120 and 69.  Lipase was normal at 25.  CT angiogram of the chest and abdomen and pelvis was unremarkable.  Blood cultures were obtained and started on empiric antibiotics.  Patient was admitted for SIRS picture in the setting of sickle cell pain crisis.  COVID-19 test negative.  Hospital Course:  Sickle cell disease with pain crisis: Patient was admitted for sickle cell pain crisis and managed appropriately with IVF, IV Dilaudid via PCA and IV Toradol, as well as other adjunct therapies per sickle cell pain management protocols.  IV Dilaudid PCA weaned appropriately.  Patient was transition to oxycodone.  She will complete treatment with oxycodone 5 mg every 6 hours as needed for severe pain.  Use interchangeably with ibuprofen 800 mg every 8 hours as needed with food.  Continue to hydrate with 64 ounces of fluid. Current pain intensity is 3/10.  Patient can manage at home on this medication regimen.  Prior to prescribing opiate medications, PDMP was reviewed and no inconsistencies were noted. WBCs trended down appropriately, leukocytosis was considered to be secondary to vaso-occlusive pain crisis. Hemoglobin 8.8, which remains consistent with patient's baseline. Patient will follow-up with PCP in 2 weeks to repeat CBC with differential and CMP. Patient alert, oriented, and ambulating without assistance.  Oxygen saturation is 98% on RA.  Patient was therefore discharged home today in a hemodynamically stable condition.   Shelby Coffey will follow-up with PCP within 1 week of this discharge.  Shelby Coffey was counseled extensively about nonpharmacologic means of pain management, patient verbalized understanding and was appreciative of  the care received during this admission.   We discussed the need for good hydration, monitoring of hydration status, avoidance of heat, cold, stress, and infection triggers. We discussed the need to be  adherent with taking Hydrea and other home medications. Patient was reminded of the need to seek medical attention immediately if any symptom of bleeding, anemia, or infection occurs.  Discharge Exam: Vitals:   10/11/20 0032 10/11/20 0430  BP: 118/66 113/69  Pulse: (!) 103 84  Resp: 16 20  Temp: 99.3 F (37.4 C) 98 F (36.7 C)  SpO2: 95% 99%   Vitals:   10/10/20 1816 10/10/20 2016 10/11/20 0032 10/11/20 0430  BP: 118/71 (!) 122/93 118/66 113/69  Pulse: 96 83 (!) 103 84  Resp: 15 20 16 20   Temp: 98.3 F (36.8 C) 98.4 F (36.9 C) 99.3 F (37.4 C) 98 F (36.7 C)  TempSrc: Oral Oral Oral Oral  SpO2: 95% 97% 95% 99%  Weight:      Height:        General appearance : Awake, alert, not in any distress. Speech Clear. Not toxic looking HEENT: Atraumatic and Normocephalic, pupils equally reactive to light and accomodation Neck: Supple, no JVD. No cervical lymphadenopathy.  Chest: Good air entry bilaterally, no added sounds  CVS: S1 S2 regular, no murmurs.  Abdomen: Bowel sounds present, Non tender and not distended with no guarding, rigidity or rebound. Extremities: B/L Lower Ext shows no edema, both legs are warm to touch Neurology: Awake alert, and oriented X 3, CN II-XII intact, Non focal Skin: No Rash  Discharge Instructions  Discharge Instructions    Discharge patient   Complete by: As directed    Discharge disposition: 01-Home or Self Care   Discharge patient date: 10/11/2020     Allergies as of 10/11/2020   No Known Allergies     Medication List    TAKE these medications   acetaminophen 650 MG CR tablet Commonly known as: TYLENOL Take 650 mg by mouth every 8 (eight) hours as needed for pain.   albuterol 108 (90 Base) MCG/ACT inhaler Commonly known as: VENTOLIN HFA Inhale 2 puffs into the lungs every 6 (six) hours as needed for wheezing or shortness of breath.   amphetamine-dextroamphetamine 15 MG tablet Commonly known as: ADDERALL Take 15 mg by mouth  daily.   CHLOROPHYLL PO Take 1 capsule by mouth daily.   DULoxetine 30 MG capsule Commonly known as: CYMBALTA Take 30 mg by mouth daily.   ELDERBERRY PO Take 1 capsule by mouth daily.   etonogestrel 68 MG Impl implant Commonly known as: NEXPLANON 68 mg by Subdermal route continuous.   folic acid 1 MG tablet Commonly known as: FOLVITE Take 1 tablet (1 mg total) by mouth daily.   hydrOXYzine 25 MG tablet Commonly known as: ATARAX/VISTARIL Take 25 mg by mouth daily as needed for anxiety.   ibuprofen 800 MG tablet Commonly known as: ADVIL Take 1 tablet (800 mg total) by mouth 3 (three) times daily.   OVER THE COUNTER MEDICATION Take 1 capsule by mouth daily. Sea moss   oxyCODONE 5 MG immediate release tablet Commonly known as: Oxy IR/ROXICODONE Take 1 tablet (5 mg total) by mouth every 4 (four) hours as needed for severe pain.       The results of significant diagnostics from this hospitalization (including imaging, microbiology, ancillary and laboratory) are listed below for reference.  Significant Diagnostic Studies: DG Chest 2 View  Result Date: 10/06/2020 CLINICAL DATA:  Central chest pain, abdominal pain EXAM: CHEST - 2 VIEW COMPARISON:  07/20/2020 FINDINGS: Frontal and lateral views of the chest demonstrate a stable cardiac silhouette. No acute airspace disease, effusion, or pneumothorax. Right convex thoracic scoliosis again noted. No acute bony abnormality. IMPRESSION: 1. No acute intrathoracic process. Electronically Signed   By: Sharlet Salina M.D.   On: 10/06/2020 19:59   CT Angio Chest PE W/Cm &/Or Wo Cm  Result Date: 10/06/2020 CLINICAL DATA:  Nonlocalized acute abdominal pain, chest pain. Sickle cell. EXAM: CT ANGIOGRAPHY CHEST CT ABDOMEN AND PELVIS WITH CONTRAST TECHNIQUE: Multidetector CT imaging of the chest was performed using the standard protocol during bolus administration of intravenous contrast. Multiplanar CT image reconstructions and MIPs were  obtained to evaluate the vascular anatomy. Multidetector CT imaging of the abdomen and pelvis was performed using the standard protocol during bolus administration of intravenous contrast. CONTRAST:  OMNIPAQUE IOHEXOL 350 MG/ML SOLN COMPARISON:  Chest x-ray 02/08/2017, 06/28/2018, 07/20/2020. CT angiography chest 07/22/2020 FINDINGS: CTA CHEST FINDINGS Cardiovascular: Satisfactory opacification of the pulmonary arteries to the segmental level. No evidence of pulmonary embolism. Normal heart size. No pericardial effusion. Mediastinum/Nodes: No enlarged mediastinal, hilar, or axillary lymph nodes. Thyroid gland, trachea, and esophagus demonstrate no significant findings. Lungs/Pleura: Bilateral lower lobe subsegmental atelectasis. Trace linear atelectasis within the lingula. No focal consolidation. No pulmonary nodule or mass. No pleural effusion or pneumothorax. Musculoskeletal: No chest wall abnormality. No acute or significant osseous findings. Dextroscoliosis of thoracic spine appears similar to prior. Review of the MIP images confirms the above findings. CT ABDOMEN and PELVIS FINDINGS Hepatobiliary: No focal liver abnormality is seen. No gallstones, gallbladder wall thickening, or biliary dilatation. Pancreas: Unremarkable. No pancreatic ductal dilatation or surrounding inflammatory changes. Spleen: Normal in size without focal abnormality. Adrenals/Urinary Tract: Adrenal glands are unremarkable. Kidneys are normal, without renal calculi, focal lesion, or hydronephrosis. Bladder is unremarkable. Stomach/Bowel: Stomach is within normal limits. Appendix appears normal. No evidence of bowel wall thickening, distention, or inflammatory changes. Vascular/Lymphatic: No significant vascular findings are present. No enlarged abdominal or pelvic lymph nodes. Reproductive: Incidentally noted 2.8 cm cystic lesion within the left ovary. No follow-up imaging recommended. Note: This recommendation does not apply to  premenarchal patients and to those with increased risk (genetic, family history, elevated tumor markers or other high-risk factors) of ovarian cancer. Reference: JACR 2020 Feb; 17(2):248-254. Otherwise the uterus and bilateral adnexa are unremarkable. Other: No intraperitoneal free fluid. No intraperitoneal free gas. No organized fluid collection. Musculoskeletal: No acute or significant osseous findings. Review of the MIP images confirms the above findings. IMPRESSION: 1. No central or segmental pulmonary embolus. 2. No acute intrathoracic abnormality. 3. No acute intra-abdominal or intrapelvic abnormality. 4. Dextroscoliosis of the thoracic spine. Electronically Signed   By: Tish Frederickson M.D.   On: 10/06/2020 22:55   CT Abdomen Pelvis W Contrast  Result Date: 10/06/2020 CLINICAL DATA:  Nonlocalized acute abdominal pain, chest pain. Sickle cell. EXAM: CT ANGIOGRAPHY CHEST CT ABDOMEN AND PELVIS WITH CONTRAST TECHNIQUE: Multidetector CT imaging of the chest was performed using the standard protocol during bolus administration of intravenous contrast. Multiplanar CT image reconstructions and MIPs were obtained to evaluate the vascular anatomy. Multidetector CT imaging of the abdomen and pelvis was performed using the standard protocol during bolus administration of intravenous contrast. CONTRAST:  OMNIPAQUE IOHEXOL 350 MG/ML SOLN COMPARISON:  Chest x-ray 02/08/2017, 06/28/2018, 07/20/2020. CT angiography chest 07/22/2020 FINDINGS:  CTA CHEST FINDINGS Cardiovascular: Satisfactory opacification of the pulmonary arteries to the segmental level. No evidence of pulmonary embolism. Normal heart size. No pericardial effusion. Mediastinum/Nodes: No enlarged mediastinal, hilar, or axillary lymph nodes. Thyroid gland, trachea, and esophagus demonstrate no significant findings. Lungs/Pleura: Bilateral lower lobe subsegmental atelectasis. Trace linear atelectasis within the lingula. No focal consolidation. No  pulmonary nodule or mass. No pleural effusion or pneumothorax. Musculoskeletal: No chest wall abnormality. No acute or significant osseous findings. Dextroscoliosis of thoracic spine appears similar to prior. Review of the MIP images confirms the above findings. CT ABDOMEN and PELVIS FINDINGS Hepatobiliary: No focal liver abnormality is seen. No gallstones, gallbladder wall thickening, or biliary dilatation. Pancreas: Unremarkable. No pancreatic ductal dilatation or surrounding inflammatory changes. Spleen: Normal in size without focal abnormality. Adrenals/Urinary Tract: Adrenal glands are unremarkable. Kidneys are normal, without renal calculi, focal lesion, or hydronephrosis. Bladder is unremarkable. Stomach/Bowel: Stomach is within normal limits. Appendix appears normal. No evidence of bowel wall thickening, distention, or inflammatory changes. Vascular/Lymphatic: No significant vascular findings are present. No enlarged abdominal or pelvic lymph nodes. Reproductive: Incidentally noted 2.8 cm cystic lesion within the left ovary. No follow-up imaging recommended. Note: This recommendation does not apply to premenarchal patients and to those with increased risk (genetic, family history, elevated tumor markers or other high-risk factors) of ovarian cancer. Reference: JACR 2020 Feb; 17(2):248-254. Otherwise the uterus and bilateral adnexa are unremarkable. Other: No intraperitoneal free fluid. No intraperitoneal free gas. No organized fluid collection. Musculoskeletal: No acute or significant osseous findings. Review of the MIP images confirms the above findings. IMPRESSION: 1. No central or segmental pulmonary embolus. 2. No acute intrathoracic abnormality. 3. No acute intra-abdominal or intrapelvic abnormality. 4. Dextroscoliosis of the thoracic spine. Electronically Signed   By: Tish Frederickson M.D.   On: 10/06/2020 22:55    Microbiology: Recent Results (from the past 240 hour(s))  Culture, blood (routine x  2)     Status: None (Preliminary result)   Collection Time: 10/07/20 12:00 AM   Specimen: BLOOD  Result Value Ref Range Status   Specimen Description   Final    BLOOD RIGHT ANTECUBITAL Performed at [facility], [address] [place], [place] [address]    Special Requests   Final    BOTTLES DRAWN AEROBIC AND ANAEROBIC Blood Culture adequate volume Performed at [facility], [address], [place], [place] [address]    Culture   Final    NO GROWTH 4 DAYS Performed at [facility], [address] [place], [place] [address]    Report Status PENDING  Incomplete  Resp Panel by RT-PCR (Flu A&B, Covid) Nasopharyngeal Swab     Status: None   Collection Time: 10/07/20 12:03 AM   Specimen: Nasopharyngeal Swab; Nasopharyngeal(NP) swabs in vial transport medium  Result Value Ref Range Status   SARS Coronavirus 2 by RT PCR NEGATIVE NEGATIVE Final    Comment: (NOTE) SARS-CoV-2 target nucleic acids are NOT DETECTED.  The SARS-CoV-2 RNA is generally detectable in upper respiratory specimens during the acute phase of infection. The lowest concentration of SARS-CoV-2 viral copies this assay can detect is 138 copies/mL. A negative result does not preclude SARS-Cov-2 infection and should not be used as the sole basis for treatment or other patient management decisions. A negative result may occur with  improper specimen collection/handling, submission of specimen other than nasopharyngeal swab, presence of viral mutation(s) within the areas targeted by this assay, and inadequate number of viral copies(<138  copies/mL). A negative result must be combined with clinical observations, patient history, and epidemiological information. The expected result is Negative.  Fact Sheet for Patients:  BloggerCourse.comhttps://www.fda.gov/media/152166/download  Fact Sheet for Healthcare Providers:  SeriousBroker.ithttps://www.fda.gov/media/152162/download  This test is no t yet approved or  cleared by the [place] FDA and  has been authorized for detection and/or diagnosis of SARS-CoV-2 by FDA under an Emergency Use Authorization (EUA). This EUA will remain  in effect (meaning this test can be used) for the duration of the COVID-19 declaration under Section 564(b)(1) of the Act, 21 U.S.C.section 360bbb-3(b)(1), unless the authorization is terminated  or revoked sooner.       Influenza A by PCR NEGATIVE NEGATIVE Final   Influenza B by PCR NEGATIVE NEGATIVE Final    Comment: (NOTE) The Xpert Xpress SARS-CoV-2/FLU/RSV plus assay is intended as an aid in the diagnosis of influenza from Nasopharyngeal swab specimens and should not be used as a sole basis for treatment. Nasal washings and aspirates are unacceptable for Xpert Xpress SARS-CoV-2/FLU/RSV testing.  Fact Sheet for Patients: BloggerCourse.comhttps://www.fda.gov/media/152166/download  Fact Sheet for Healthcare Providers: SeriousBroker.ithttps://www.fda.gov/media/152162/download  This test is not yet approved or cleared by the [place] FDA and has been authorized for detection and/or diagnosis of SARS-CoV-2 by FDA under an Emergency Use Authorization (EUA). This EUA will remain in effect (meaning this test can be used) for the duration of the COVID-19 declaration under Section 564(b)(1) of the Act, 21 U.S.C. section 360bbb-3(b)(1), unless the authorization is terminated or revoked.  Performed at [facility], [address] [place], [place] [address]   Culture, blood (routine x 2)     Status: None (Preliminary result)   Collection Time: 10/07/20 12:20 AM   Specimen: BLOOD  Result Value Ref Range Status   Specimen Description   Final    BLOOD RIGHT ANTECUBITAL Performed at [facility], [address] [place], [place] [address]    Special Requests   Final    BOTTLES DRAWN AEROBIC AND ANAEROBIC Blood Culture adequate volume Performed at [facility] [place] [facility], [address]  [place], [place] [address]    Culture   Final    NO GROWTH 4 DAYS Performed at [facility], [address] [place], [place] [address]    Report Status PENDING  Incomplete  MRSA PCR Screening     Status: None   Collection Time: 10/07/20 10:44 AM   Specimen: Nasal Mucosa; Nasopharyngeal  Result Value Ref Range Status   MRSA by PCR NEGATIVE NEGATIVE Final    Comment:        The GeneXpert MRSA Assay (FDA approved for NASAL specimens only), is one component of a comprehensive MRSA colonization surveillance program. It is not intended to diagnose MRSA infection nor to guide or monitor treatment for MRSA infections. Performed at [facility], [address] [place], [place] [address]      Labs: Basic Metabolic Panel: Recent Labs  Lab 10/06/20 1941 10/07/20 0517  NA 137 135  K 4.5 3.7  CL 104 103  CO2 24 16*  GLUCOSE 82 96  BUN 5* 6  CREATININE 0.69 0.75  CALCIUM 9.4 8.7*   Liver Function Tests: Recent Labs  Lab 10/06/20 1941 10/07/20 0517  AST 120* 95*  ALT 69* 55*  ALKPHOS 67 59  BILITOT 1.3* 1.5*  PROT 8.4* 7.4  ALBUMIN 4.4 3.8   Recent Labs  Lab 10/06/20 1941  LIPASE 25   No results for input(s):  AMMONIA in the last 168 hours. CBC: Recent Labs  Lab 10/06/20 2016 10/07/20 0517 10/08/20 0714 10/09/20 1218 10/10/20 0556  WBC 25.1* 28.1* 25.0* 21.5* 16.4*  NEUTROABS 9.3* 4.2  --   --   --   HGB 11.2* 9.9* 9.5* 9.2* 8.8*  HCT 30.3* 26.8* 26.0* 25.6* 23.9*  MCV 76.5* 76.8* 77.4* 79.0* 76.6*  PLT 349 280 282 276 286   Cardiac Enzymes: No results for input(s): CKTOTAL, CKMB, CKMBINDEX, TROPONINI in the last 168 hours. BNP: Invalid input(s): POCBNP CBG: No results for input(s): GLUCAP in the last 168 hours.  Time coordinating discharge: 35 minutes  Signed:  Nolon Nations  APRN, MSN, FNP-C [facility] [address] [place], [place] [address] [phone]  [facility] 10/11/2020, 10:09 AM

## 2020-10-11 NOTE — Discharge Instructions (Signed)
Sickle Cell Anemia, Adult  Sickle cell anemia is a condition where your red blood cells are shaped like sickles. Red blood cells carry oxygen through the body. Sickle-shaped cells do not live as long as normal red blood cells. They also clump together and block blood from flowing through the blood vessels. This prevents the body from getting enough oxygen. Sickle cell anemia causes organ damage and pain. It also increases the risk of infection. Follow these instructions at home: Medicines  Take over-the-counter and prescription medicines only as told by your doctor.  If you were prescribed an antibiotic medicine, take it as told by your doctor. Do not stop taking the antibiotic even if you start to feel better.  If you develop a fever, do not take medicines to lower the fever right away. Tell your doctor about the fever. Managing pain, stiffness, and swelling  Try these methods to help with pain: ? Use a heating pad. ? Take a warm bath. ? Distract yourself, such as by watching TV. Eating and drinking  Drink enough fluid to keep your pee (urine) clear or pale yellow. Drink more in hot weather and during exercise.  Limit or avoid alcohol.  Eat a healthy diet. Eat plenty of fruits, vegetables, whole grains, and lean protein.  Take vitamins and supplements as told by your doctor. Traveling  When traveling, keep these with you: ? Your medical information. ? The names of your doctors. ? Your medicines.  If you need to take an airplane, talk to your doctor first. Activity  Rest often.  Avoid exercises that make your heart beat much faster, such as jogging. General instructions  Do not use products that have nicotine or tobacco, such as cigarettes and e-cigarettes. If you need help quitting, ask your doctor.  Consider wearing a medical alert bracelet.  Avoid being in high places (high altitudes), such as mountains.  Avoid very hot or cold temperatures.  Avoid places where the  temperature changes a lot.  Keep all follow-up visits as told by your doctor. This is important. Contact a doctor if:  A joint hurts.  Your feet or hands hurt or swell.  You feel tired (fatigued). Get help right away if:  You have symptoms of infection. These include: ? Fever. ? Chills. ? Being very tired. ? Irritability. ? Poor eating. ? Throwing up (vomiting).  You feel dizzy or faint.  You have new stomach pain, especially on the left side.  You have a an erection (priapism) that lasts more than 4 hours.  You have numbness in your arms or legs.  You have a hard time moving your arms or legs.  You have trouble talking.  You have pain that does not go away when you take medicine.  You are short of breath.  You are breathing fast.  You have a long-term cough.  You have pain in your chest.  You have a bad headache.  You have a stiff neck.  Your stomach looks bloated even though you did not eat much.  Your skin is pale.  You suddenly cannot see well. Summary  Sickle cell anemia is a condition where your red blood cells are shaped like sickles.  Follow your doctor's advice on ways to manage pain, food to eat, activities to do, and steps to take for safe travel.  Get medical help right away if you have any signs of infection, such as a fever. This information is not intended to replace advice given to you by   your health care provider. Make sure you discuss any questions you have with your health care provider. Document Revised: 12/07/2019 Document Reviewed: 12/07/2019 Elsevier Patient Education  2021 [employer]  

## 2020-10-12 LAB — CULTURE, BLOOD (ROUTINE X 2)
Culture: NO GROWTH
Culture: NO GROWTH
Special Requests: ADEQUATE
Special Requests: ADEQUATE

## 2021-01-04 ENCOUNTER — Other Ambulatory Visit: Payer: Self-pay

## 2021-01-04 ENCOUNTER — Emergency Department (HOSPITAL_COMMUNITY)
Admission: EM | Admit: 2021-01-04 | Discharge: 2021-01-05 | Disposition: A | Discharge status: Home / Self Care | Payer: Medicaid Other | Attending: Emergency Medicine | Admitting: Emergency Medicine

## 2021-01-04 ENCOUNTER — Encounter (HOSPITAL_COMMUNITY): Payer: Self-pay

## 2021-01-04 DIAGNOSIS — K0889 Other specified disorders of teeth and supporting structures: Secondary | ICD-10-CM | POA: Diagnosis not present

## 2021-01-04 DIAGNOSIS — R109 Unspecified abdominal pain: Secondary | ICD-10-CM | POA: Insufficient documentation

## 2021-01-04 DIAGNOSIS — D57219 Sickle-cell/Hb-C disease with crisis, unspecified: Secondary | ICD-10-CM | POA: Insufficient documentation

## 2021-01-04 DIAGNOSIS — D5700 Hb-SS disease with crisis, unspecified: Secondary | ICD-10-CM

## 2021-01-04 DIAGNOSIS — J45909 Unspecified asthma, uncomplicated: Secondary | ICD-10-CM | POA: Diagnosis not present

## 2021-01-04 LAB — COMPREHENSIVE METABOLIC PANEL
ALT: 12 U/L (ref 0–44)
AST: 24 U/L (ref 15–41)
Albumin: 4.6 g/dL (ref 3.5–5.0)
Alkaline Phosphatase: 53 U/L (ref 38–126)
Anion gap: 8 (ref 5–15)
BUN: 7 mg/dL (ref 6–20)
CO2: 22 mmol/L (ref 22–32)
Calcium: 9.3 mg/dL (ref 8.9–10.3)
Chloride: 107 mmol/L (ref 98–111)
Creatinine, Ser: 0.76 mg/dL (ref 0.44–1.00)
GFR, Estimated: >60 mL/min (ref >60)
Glucose, Bld: 87 mg/dL (ref 70–99)
Potassium: 3.5 mmol/L (ref 3.5–5.1)
Sodium: 137 mmol/L (ref 135–145)
Total Bilirubin: 1.3 mg/dL — ABNORMAL HIGH (ref 0.3–1.2)
Total Protein: 8.5 g/dL — ABNORMAL HIGH (ref 6.5–8.1)

## 2021-01-04 LAB — CBC WITH DIFFERENTIAL/PLATELET
Abs Immature Granulocytes: 0.05 10*3/uL (ref 0.00–0.07)
Basophils Absolute: 0.1 10*3/uL (ref 0.0–0.1)
Basophils Relative: 0 %
Eosinophils Absolute: 0.1 10*3/uL (ref 0.0–0.5)
Eosinophils Relative: 1 %
HCT: 31.3 % — ABNORMAL LOW (ref 36.0–46.0)
Hemoglobin: 11.6 g/dL — ABNORMAL LOW (ref 12.0–15.0)
Immature Granulocytes: 0 %
Lymphocytes Relative: 19 %
Lymphs Abs: 2.9 10*3/uL (ref 0.7–4.0)
MCH: 28.6 pg (ref 26.0–34.0)
MCHC: 37.1 g/dL — ABNORMAL HIGH (ref 30.0–36.0)
MCV: 77.1 fL — ABNORMAL LOW (ref 80.0–100.0)
Monocytes Absolute: 1.1 10*3/uL — ABNORMAL HIGH (ref 0.1–1.0)
Monocytes Relative: 7 %
Neutro Abs: 10.8 10*3/uL — ABNORMAL HIGH (ref 1.7–7.7)
Neutrophils Relative %: 73 %
Platelets: 389 10*3/uL (ref 150–400)
RBC: 4.06 MIL/uL (ref 3.87–5.11)
RDW: 16.1 % — ABNORMAL HIGH (ref 11.5–15.5)
WBC: 14.9 10*3/uL — ABNORMAL HIGH (ref 4.0–10.5)
nRBC: 0.3 % — ABNORMAL HIGH (ref 0.0–0.2)

## 2021-01-04 LAB — I-STAT BETA HCG BLOOD, ED (MC, WL, AP ONLY): I-stat hCG, quantitative: <5 m[IU]/mL (ref <5)

## 2021-01-04 LAB — RETICULOCYTES
Immature Retic Fract: 34.6 % — ABNORMAL HIGH (ref 2.3–15.9)
RBC.: 4.04 MIL/uL (ref 3.87–5.11)
Retic Count, Absolute: 120.8 10*3/uL (ref 19.0–186.0)
Retic Ct Pct: 3 % (ref 0.4–3.1)

## 2021-01-04 MED ORDER — MORPHINE SULFATE (PF) 4 MG/ML IV SOLN
6.0000 mg | Freq: Once | INTRAVENOUS | Status: AC
Start: 2021-01-04 — End: 2021-01-04
  Administered 2021-01-04: 6 mg via INTRAVENOUS
  Filled 2021-01-04: qty 2

## 2021-01-04 MED ORDER — KETOROLAC TROMETHAMINE 15 MG/ML IJ SOLN
15.0000 mg | Freq: Once | INTRAMUSCULAR | Status: AC
  Administered 2021-01-04: 15 mg via INTRAVENOUS
  Filled 2021-01-04: qty 1

## 2021-01-04 MED ORDER — AMOXICILLIN 500 MG PO CAPS
500.0000 mg | ORAL_CAPSULE | Freq: Once | ORAL | Status: AC
  Administered 2021-01-04: 500 mg via ORAL
  Filled 2021-01-04: qty 1

## 2021-01-04 MED ORDER — AMOXICILLIN 500 MG PO CAPS: 500.0000 mg | ORAL_CAPSULE | Freq: Three times a day (TID) | ORAL | 0 refills | Status: AC

## 2021-01-04 MED ORDER — MORPHINE SULFATE (PF) 4 MG/ML IV SOLN
4.0000 mg | Freq: Once | INTRAVENOUS | Status: AC
  Administered 2021-01-04: 4 mg via INTRAVENOUS
  Filled 2021-01-04: qty 1

## 2021-01-04 MED ORDER — OXYCODONE HCL 5 MG PO TABS: 5.0000 mg | ORAL_TABLET | ORAL | 0 refills | Status: AC | PRN

## 2021-01-04 MED ORDER — OXYCODONE-ACETAMINOPHEN 5-325 MG PO TABS
1.0000 | ORAL_TABLET | Freq: Once | ORAL | Status: AC
  Administered 2021-01-04: 1 via ORAL
  Filled 2021-01-04: qty 1

## 2021-01-04 MED ORDER — ONDANSETRON HCL 4 MG/2ML IJ SOLN
4.0000 mg | Freq: Once | INTRAMUSCULAR | Status: AC
  Administered 2021-01-04: 4 mg via INTRAVENOUS
  Filled 2021-01-04: qty 2

## 2021-01-04 NOTE — ED Provider Notes (Signed)
Emergency Medicine Provider Triage Evaluation Note  Shelby Coffey , a 20 y.o. female  was evaluated in triage.  Pt complains of dental pain and sickle cell pain crisis.  Dental pain-saw her dentist a week ago had a routine cleaning.  After that she noticed little bit of pain in her mouth.  Has any difficulty swallowing, fevers, discharge.  Sickle cell pain crisis started earlier today.  Manifest is cramping arm and leg pain.  She is not on outpatient opioid medicines.  Patient having chest pain or shortness of breath.  Review of Systems  Positive: Arm and leg cramping pain, dental pain, sickle cell pain crisis Negative: Chest pain, shortness of breath, fever, chills  Physical Exam  BP 128/90 (BP Location: Left Arm)   Pulse 82   Temp 99 F (37.2 C) (Oral)   Resp 16   Ht 5\' 7"  (1.702 m)   Wt 65.5 kg   LMP 12/28/2020   SpO2 99%   BMI 22.60 kg/m  Gen:   Awake, no distress   Resp:  Normal effort  MSK:   Moves extremities without difficulty  Other:  No sublingual tenderness or swelling.  Unable to appreciate an abscess to the teeth.  S1-S2 normal.  Not in acute respiratory distress.  Medical Decision Making  Medically screening exam initiated at 8:06 PM.  Appropriate orders placed.  Shelby Coffey was informed that the remainder of the evaluation will be completed by another provider, this initial triage assessment does not replace that evaluation, and the importance of remaining in the ED until their evaluation is complete.     [name], PA-C 01/04/21 2015    03/06/21, MD 01/06/21 [phone]

## 2021-01-04 NOTE — ED Triage Notes (Signed)
Pt reports generalized pain to arms and legs. Pt has hx of sickle cell. Pt also endorses dental pain.

## 2021-01-04 NOTE — Discharge Instructions (Addendum)
If you start running fever, having worsening pain, vomiting unable to hold down your medications please return to the emergency room.  If not follow-up with your doctor at least by phone on Monday for further evaluation if you are still having symptoms and if you need further pain medication.

## 2021-01-04 NOTE — ED Provider Notes (Signed)
[facility]-EMERGENCY DEPT Provider Note   CSN: [id number] Arrival date & time: 01/04/21  1944     History Chief Complaint  Patient presents with   Sickle Cell Pain Crisis    Shelby Coffey is a 20 y.o. female.  Patient is a 20 year old female with a history of sickle cell anemia, depression and asthma who is presenting today with diffuse body pain and also right lower dental pain.  Patient reports that she went for a dental cleaning about a week ago but then noticed 2 days ago that her right lower second bicuspid was starting to hurt.  She has not noticed any mouth swelling but the pain does radiate to her lip.  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  She has abdominal pain, upper and lower extremity pain.  She denies any fever, chest pain, shortness of breath.  She does not have pain medication at home but did take ibuprofen.  She reports the ibuprofen has not helped.  She denies any joint pain or swelling.  The history is provided by the patient.  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 No pertinent surgical history.   OB History     Gravida  0   Para  0   Term  0   Preterm  0   AB  0   Living  0      SAB  0   IAB  0   Ectopic  0   Multiple  0   Live Births  0           Family History  Problem Relation Age of Onset    Breast cancer Mother    Sickle cell trait Sister    Diabetes type II Maternal Grandmother     Social History   Tobacco Use   Smoking status: Never   Smokeless tobacco: Never  Substance Use Topics   Alcohol use: No   Drug use: Yes    Types: Marijuana    Comment: less than once a week    Home Medications Prior to Admission medications   Medication Sig Start Date End Date Taking? Authorizing Provider  acetaminophen (TYLENOL) 650 MG CR tablet Take 650 mg by mouth every 8 (eight) hours as needed for pain.   Yes [provider]  CHLOROPHYLL PO Take 1 capsule by mouth daily.   Yes [provider]  DULoxetine (CYMBALTA) 30 MG capsule Take 30 mg by mouth daily as needed (anxiety).   Yes [provider]  ELDERBERRY PO Take 1 capsule by mouth daily.   Yes [provider]  folic acid (FOLVITE) 1 MG tablet Take 1 tablet (1 mg total) by mouth daily. 10/11/20 10/11/21 Yes Massie Maroon, FNP  ibuprofen (ADVIL) 800 MG tablet Take 1 tablet (800 mg total) by  mouth 3 (three) times daily. Patient taking differently: Take 800 mg by mouth every 8 (eight) hours as needed for mild pain. 10/11/20  Yes Massie MaroonHollis, Lachina M, FNP  medroxyPROGESTERone (DEPO-PROVERA) 150 MG/ML injection Inject 150 mg into the muscle every 3 (three) months.   Yes [provider]  Omega-3 Fatty Acids (FISH OIL) 1000 MG CAPS Take 1 capsule by mouth daily.   Yes [provider]  OVER THE COUNTER MEDICATION Take 1 capsule by mouth daily. Sea moss   Yes [provider]  hydrOXYzine (ATARAX/VISTARIL) 25 MG tablet Take 25 mg by mouth daily as needed for anxiety. Patient not taking: No sig reported    [provider]  oxyCODONE (OXY IR/ROXICODONE) 5 MG immediate release tablet Take 1 tablet (5 mg total) by mouth every 4 (four) hours as needed for severe pain. Patient not taking: Reported on 01/04/2021 10/11/20   Massie MaroonHollis, Lachina M, FNP    Allergies    Patient has no  known allergies.  Review of Systems   Review of Systems  All other systems reviewed and are negative.  Physical Exam Updated Vital Signs BP 111/76   Pulse 73   Temp 99 F (37.2 C) (Oral)   Resp 16   Ht 5\' 7"  (1.702 m)   Wt 65.5 kg   LMP 12/28/2020   SpO2 94%   BMI 22.60 kg/m   Physical Exam Vitals and nursing note reviewed.  Constitutional:      General: She is not in acute distress.    Appearance: Normal appearance. She is well-developed.  HENT:     Head: Normocephalic and atraumatic.     Mouth/Throat:     Dentition: Dental tenderness present. No dental caries or dental abscesses.   Eyes:     Pupils: Pupils are equal, round, and reactive to light.  Cardiovascular:     Rate and Rhythm: Normal rate and regular rhythm.     Heart sounds: Normal heart sounds. No murmur heard.   No friction rub.  Pulmonary:     Effort: Pulmonary effort is normal.     Breath sounds: Normal breath sounds. No wheezing or rales.  Abdominal:     General: Bowel sounds are normal. There is no distension.     Palpations: Abdomen is soft.     Tenderness: There is no abdominal tenderness. There is no guarding or rebound.  Musculoskeletal:        General: Tenderness present. Normal range of motion.     Cervical back: Normal range of motion and neck supple.     Comments: No edema.  Mild diffuse tenderness with palpation of arms and legs.  No joint pain or decreased range of motion.  No joint swelling.  Skin:    General: Skin is warm and dry.     Findings: No rash.  Neurological:     Mental Status: She is alert and oriented to person, place, and time. Mental status is at baseline.     Cranial Nerves: No cranial nerve deficit.  Psychiatric:        Mood and Affect: Mood normal.        Behavior: Behavior normal.    ED Results / Procedures / Treatments   Labs (all labs ordered are listed, but only abnormal results are displayed) Labs Reviewed  COMPREHENSIVE METABOLIC PANEL - Abnormal; Notable  for the following components:      Result Value   Total Protein 8.5 (*)    Total Bilirubin 1.3 (*)  All other components within normal limits  CBC WITH DIFFERENTIAL/PLATELET - Abnormal; Notable for the following components:   WBC 14.9 (*)    Hemoglobin 11.6 (*)    HCT 31.3 (*)    MCV 77.1 (*)    MCHC 37.1 (*)    RDW 16.1 (*)    nRBC 0.3 (*)    Neutro Abs 10.8 (*)    Monocytes Absolute 1.1 (*)    All other components within normal limits  RETICULOCYTES - Abnormal; Notable for the following components:   Immature Retic Fract 34.6 (*)    All other components within normal limits  I-STAT BETA HCG BLOOD, ED ([facility], [facility], [facility] ONLY)    EKG None  Radiology No results found.  Procedures Procedures   Medications Ordered in ED Medications  morphine 4 MG/ML injection 4 mg (4 mg Intravenous Given 01/04/21 2138)  ondansetron (ZOFRAN) injection 4 mg (4 mg Intravenous Given 01/04/21 2136)  ketorolac (TORADOL) 15 MG/ML injection 15 mg (15 mg Intravenous Given 01/04/21 2137)  amoxicillin (AMOXIL) capsule 500 mg (500 mg Oral Given 01/04/21 2138)  morphine 4 MG/ML injection 6 mg (6 mg Intravenous Given 01/04/21 2239)    ED Course  I have reviewed the triage vital signs and the nursing notes.  Pertinent labs & imaging results that were available during my care of the patient were reviewed by me and considered in my medical decision making (see chart for details).    MDM Rules/Calculators/A&P                          Patient presenting today with complaint of pain all over.  She is also complaining of dental pain.  She does not have significant dental infection but does have some mild gum erythema.  Patient states this feels like her prior sickle cell pain.  She has been taking some ibuprofen at home but it is not helping.  She is not take chronic pain medications.  Currently pain is an 8 out of 10.  Patient denies any symptoms concerning for acute chest or acute infectious etiology otherwise.  CBC  today with leukocytosis of 15, stable hemoglobin and improved from baseline at 11, CMP within normal limits and normal reticulocyte count.  Patient has been drinking and urinating normally and low suspicion for dehydration.  She reports a Give her morphine and Toradol.  Patient was given Toradol and morphine.  After the first dose on reevaluation she said there is some improvement but still having pain and was given a second dose.  11:41 PM Pt reports her pain has improved and she thinks she can manage at home now.  She does not get pain meds regularly and her last prescription was when she was d/ced from the hospital which is consistent with the database.  Pt given pain prescription for over the weekend and to f/u with pcp on Monday.  Given return precautions and will treat with amoxicillin for possible early infection.  MDM   Amount and/or Complexity of Data Reviewed Clinical lab tests: ordered and reviewed Independent visualization of images, tracings, or specimens: yes     Final Clinical Impression(s) / ED Diagnoses Final diagnoses:  Sickle cell pain crisis (HCC)  Pain, dental    Rx / DC Orders ED Discharge Orders          Ordered    oxyCODONE (OXY IR/ROXICODONE) 5 MG immediate release tablet  Every 4 hours PRN  01/04/21 2338    amoxicillin (AMOXIL) 500 MG capsule  3 times daily        01/04/21 2339             Gwyneth Sprout, MD 01/04/21 2344

## 2021-01-04 NOTE — ED Notes (Addendum)
ED Provider at bedside. 

## 2023-11-25 ENCOUNTER — Encounter (HOSPITAL_BASED_OUTPATIENT_CLINIC_OR_DEPARTMENT_OTHER): Payer: Self-pay | Admitting: Emergency Medicine

## 2023-11-25 ENCOUNTER — Other Ambulatory Visit: Payer: Self-pay

## 2023-11-25 ENCOUNTER — Emergency Department (HOSPITAL_BASED_OUTPATIENT_CLINIC_OR_DEPARTMENT_OTHER)
Admission: EM | Admit: 2023-11-25 | Discharge: 2023-11-25 | Disposition: A | Discharge status: Home / Self Care | Attending: Emergency Medicine | Admitting: Emergency Medicine

## 2023-11-25 DIAGNOSIS — J45909 Unspecified asthma, uncomplicated: Secondary | ICD-10-CM | POA: Insufficient documentation

## 2023-11-25 DIAGNOSIS — Z4800 Encounter for change or removal of nonsurgical wound dressing: Secondary | ICD-10-CM | POA: Insufficient documentation

## 2023-11-25 DIAGNOSIS — Z4802 Encounter for removal of sutures: Secondary | ICD-10-CM

## 2023-11-25 NOTE — Discharge Instructions (Signed)
 Continue local wound care at home.  Return if you develop any worsening signs for infection.
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
## 2023-11-25 NOTE — ED Triage Notes (Signed)
 3 staples total to removed. Placed on 4/25. No signs of infection or pain.

## 2024-07-14 ENCOUNTER — Emergency Department (HOSPITAL_BASED_OUTPATIENT_CLINIC_OR_DEPARTMENT_OTHER): Admitting: Radiology

## 2024-07-14 ENCOUNTER — Emergency Department (HOSPITAL_BASED_OUTPATIENT_CLINIC_OR_DEPARTMENT_OTHER)
Admission: EM | Admit: 2024-07-14 | Discharge: 2024-07-14 | Disposition: A | Discharge status: Home / Self Care | Attending: Emergency Medicine | Admitting: Emergency Medicine

## 2024-07-14 ENCOUNTER — Encounter (HOSPITAL_BASED_OUTPATIENT_CLINIC_OR_DEPARTMENT_OTHER): Payer: Self-pay

## 2024-07-14 ENCOUNTER — Other Ambulatory Visit: Payer: Self-pay

## 2024-07-14 ENCOUNTER — Other Ambulatory Visit (HOSPITAL_BASED_OUTPATIENT_CLINIC_OR_DEPARTMENT_OTHER): Payer: Self-pay

## 2024-07-14 DIAGNOSIS — J101 Influenza due to other identified influenza virus with other respiratory manifestations: Secondary | ICD-10-CM | POA: Insufficient documentation

## 2024-07-14 DIAGNOSIS — J45909 Unspecified asthma, uncomplicated: Secondary | ICD-10-CM | POA: Insufficient documentation

## 2024-07-14 DIAGNOSIS — J129 Viral pneumonia, unspecified: Secondary | ICD-10-CM

## 2024-07-14 DIAGNOSIS — R059 Cough, unspecified: Secondary | ICD-10-CM | POA: Diagnosis present

## 2024-07-14 LAB — RESP PANEL BY RT-PCR (RSV, FLU A&B, COVID)  RVPGX2
Influenza A by PCR: POSITIVE — AB
Influenza B by PCR: NEGATIVE
Resp Syncytial Virus by PCR: NEGATIVE
SARS Coronavirus 2 by RT PCR: NEGATIVE

## 2024-07-14 MED ORDER — METHYLPREDNISOLONE 4 MG PO TBPK: ORAL_TABLET | ORAL | 0 refills | Status: DC

## 2024-07-14 MED ORDER — AMOXICILLIN-POT CLAVULANATE 875-125 MG PO TABS
1.0000 | ORAL_TABLET | Freq: Two times a day (BID) | ORAL | 0 refills | Status: AC
  Filled 2024-07-14: qty 14, 7d supply, fill #0

## 2024-07-14 MED ORDER — METHYLPREDNISOLONE 4 MG PO TBPK
ORAL_TABLET | ORAL | 0 refills | Status: AC
  Filled 2024-07-14: qty 21, 6d supply, fill #0

## 2024-07-14 NOTE — Discharge Instructions (Signed)
Contact a health care provider if: You have a fever. You have trouble sleeping because you cannot control your cough with cough medicine. Get help right away if: Your shortness of breath becomes worse. Your chest pain increases. Your sickness becomes worse, especially if you are an older adult or have a weak immune system. You cough up blood. These symptoms may be an emergency. Get help right away. Call 911. Do not wait to see if the symptoms will go away. Do not drive yourself to the hospital. 

## 2024-07-14 NOTE — ED Provider Notes (Cosign Needed)
 " [place] EMERGENC[facility] DEPARTMENT AT [facility] Provider Note   CSN: [id number] Arrival date & time: 07/14/24  1254     Patient presents with: Cough and Nasal Congestion   Shelby Coffey is a 23 y.o. female who presents emergency department with a chief complaint of flulike symptoms.  Onset asthma.  Patient reports chills, cough, fever with Tmax of 103 F 2 days ago fever of 101 F yesterday.  She has mild cough and a little bit of wheezing.  She has been using her inhaler with some relief.  She had a nosebleed last night lasting 3 minutes that resolved with firm pressure.  She noticed when she coughed this morning there was a little bit of streaky blood in her sputum.  No other symptoms at this    Cough      Prior to Admission medications  Medication Sig Start Date End Date Taking? Authorizing Provider  acetaminophen  (TYLENOL ) 650 MG CR tablet Take 650 mg by mouth every 8 (eight) hours as needed for pain.    [provider]  amoxicillin  (AMOXIL ) 500 MG capsule Take 1 capsule (500 mg total) by mouth 3 (three) times daily. 01/04/21   Doretha Folks, MD  CHLOROPHYLL PO Take 1 capsule by mouth daily.    [provider]  DULoxetine  (CYMBALTA ) 30 MG capsule Take 30 mg by mouth daily as needed (anxiety).    [provider]  ELDERBERRY PO Take 1 capsule by mouth daily.    [provider]  hydrOXYzine  (ATARAX /VISTARIL ) 25 MG tablet Take 25 mg by mouth daily as needed for anxiety. Patient not taking: No sig reported    [provider]  ibuprofen  (ADVIL ) 800 MG tablet Take 1 tablet (800 mg total) by mouth 3 (three) times daily. Patient taking differently: Take 800 mg by mouth every 8 (eight) hours as needed for mild pain. 10/11/20   Tilford Bertram HERO, FNP  medroxyPROGESTERone (DEPO-PROVERA) 150 MG/ML injection Inject 150 mg into the muscle every 3 (three) months.    [provider]  Omega-3 Fatty Acids (FISH OIL) 1000 MG CAPS Take 1  capsule by mouth daily.    [provider]  OVER THE COUNTER MEDICATION Take 1 capsule by mouth daily. Sea moss    [provider]  oxyCODONE  (OXY IR/ROXICODONE ) 5 MG immediate release tablet Take 1 tablet (5 mg total) by mouth every 4 (four) hours as needed for severe pain. 01/04/21   Doretha Folks, MD    Allergies: Patient has no known allergies.    Review of Systems  Respiratory:  Positive for cough.     Updated Vital Signs BP 125/83   Pulse (!) 107   Temp 98.3 F (36.8 C) (Oral)   Resp 18   Ht 5' 6 (1.676 m)   Wt 65.8 kg   SpO2 96%   BMI 23.40 kg/m   Physical Exam Vitals and nursing note reviewed.  Constitutional:      General: She is not in acute distress.    Appearance: She is well-developed. She is not diaphoretic.  HENT:     Head: Normocephalic and atraumatic.     Right Ear: External ear normal.     Left Ear: External ear normal.     Nose: Nose normal.     Mouth/Throat:     Mouth: Mucous membranes are moist.  Eyes:     General: No scleral icterus.    Conjunctiva/sclera: Conjunctivae normal.  Cardiovascular:     Rate and Rhythm:  Normal rate and regular rhythm.     Heart sounds: Normal heart sounds. No murmur heard.    No friction rub. No gallop.  Pulmonary:     Effort: Pulmonary effort is normal. No respiratory distress.     Breath sounds: Normal breath sounds.  Abdominal:     General: Bowel sounds are normal. There is no distension.     Palpations: Abdomen is soft. There is no mass.     Tenderness: There is no abdominal tenderness. There is no guarding.  Musculoskeletal:     Cervical back: Normal range of motion.  Skin:    General: Skin is warm and dry.  Neurological:     Mental Status: She is alert and oriented to person, place, and time.  Psychiatric:        Behavior: Behavior normal.     (all labs ordered are listed, but only abnormal results are displayed) Labs Reviewed  RESP PANEL BY RT-PCR (RSV, FLU A&B, COVID)  RVPGX2  - Abnormal; Notable for the following components:      Result Value   Influenza A by PCR POSITIVE (*)    All other components within normal limits    EKG: None  Radiology: No results found.   Procedures   Medications Ordered in the ED - No data to display                                  Medical Decision Making Amount and/or Complexity of Data Reviewed Radiology: ordered.  Risk Prescription drug management.   Patient here with flulike symptoms.  Cough occasional shortness of breath painful cough.  I reviewed the patient's labs she has a positive respiratory panel for influenza. I visualized and interpreted two-view chest x-ray which shows patchy airspace disease in the right middle lung.  This is likely viral pneumonia but will treat in this setting with Augmentin .  Will give her a round of steroids for her wheezing.  She has an inhaler at home she should take this scheduled 2 puffs every 4 hours.  Strict return precautions and outpatient follow-up discussed.     Final diagnoses:  None    ED Discharge Orders     None          Arloa Chroman, PA-C 07/14/24 2143  "

## 2024-07-14 NOTE — ED Triage Notes (Signed)
 Arrives POV with complaints of worsening cough, nasal congestion (with a nose bleed), and chest discomfort x5 days.

## 2024-07-25 ENCOUNTER — Other Ambulatory Visit (HOSPITAL_BASED_OUTPATIENT_CLINIC_OR_DEPARTMENT_OTHER): Payer: Self-pay
